# Patient Record
Sex: Male | Born: 1970 | Race: White | Hispanic: No | Marital: Single | Smoking: Never smoker
Health system: Southern US, Community
[De-identification: ages and names within clinical notes are randomized; demographics above are authoritative.]

## PROBLEM LIST (undated history)

## (undated) DIAGNOSIS — E119 Type 2 diabetes mellitus without complications: Secondary | ICD-10-CM

## (undated) DIAGNOSIS — E785 Hyperlipidemia, unspecified: Secondary | ICD-10-CM

## (undated) DIAGNOSIS — R55 Syncope and collapse: Secondary | ICD-10-CM

## (undated) HISTORY — DX: Hyperlipidemia, unspecified: E78.5

## (undated) HISTORY — DX: Syncope and collapse: R55

---

## 1999-04-04 ENCOUNTER — Encounter: Admission: RE | Admit: 1999-04-04 | Discharge: 1999-07-03 | Payer: Self-pay | Admitting: Internal Medicine

## 2007-03-22 ENCOUNTER — Emergency Department: Payer: Self-pay | Admitting: Emergency Medicine

## 2007-03-22 ENCOUNTER — Other Ambulatory Visit: Payer: Self-pay

## 2010-02-21 ENCOUNTER — Other Ambulatory Visit: Payer: Self-pay

## 2011-05-31 ENCOUNTER — Other Ambulatory Visit: Payer: Self-pay

## 2011-05-31 LAB — COMPREHENSIVE METABOLIC PANEL
Albumin: 3.6 g/dL (ref 3.4–5.0)
Alkaline Phosphatase: 88 U/L (ref 50–136)
Anion Gap: 8 (ref 7–16)
BUN: 16 mg/dL (ref 7–18)
Bilirubin,Total: 1.4 mg/dL — ABNORMAL HIGH (ref 0.2–1.0)
Calcium, Total: 8.7 mg/dL (ref 8.5–10.1)
Chloride: 101 mmol/L (ref 98–107)
Co2: 28 mmol/L (ref 21–32)
Creatinine: 1.02 mg/dL (ref 0.60–1.30)
EGFR (African American): >60
EGFR (Non-African Amer.): >60
Glucose: 247 mg/dL — ABNORMAL HIGH (ref 65–99)
Osmolality: 283 (ref 275–301)
Potassium: 4.9 mmol/L (ref 3.5–5.1)
SGOT(AST): 21 U/L (ref 15–37)
SGPT (ALT): 23 U/L
Sodium: 137 mmol/L (ref 136–145)
Total Protein: 7 g/dL (ref 6.4–8.2)

## 2011-05-31 LAB — LIPID PANEL
Cholesterol: 161 mg/dL (ref 0–200)
HDL Cholesterol: 59 mg/dL (ref 40–60)
Ldl Cholesterol, Calc: 89 mg/dL (ref 0–100)
Triglycerides: 65 mg/dL (ref 0–200)
VLDL Cholesterol, Calc: 13 mg/dL (ref 5–40)

## 2011-05-31 LAB — TSH: Thyroid Stimulating Horm: 1.33 u[IU]/mL

## 2011-05-31 LAB — HEMOGLOBIN A1C: Hemoglobin A1C: 8.2 % — ABNORMAL HIGH (ref 4.2–6.3)

## 2011-09-11 ENCOUNTER — Ambulatory Visit: Payer: Self-pay | Admitting: Nephrology

## 2013-10-03 ENCOUNTER — Observation Stay (HOSPITAL_COMMUNITY)
Admission: EM | Admit: 2013-10-03 | Discharge: 2013-10-04 | Disposition: A | Discharge status: Home / Self Care | Payer: 59 | Attending: Internal Medicine | Admitting: Internal Medicine

## 2013-10-03 ENCOUNTER — Emergency Department (HOSPITAL_COMMUNITY): Payer: 59

## 2013-10-03 ENCOUNTER — Encounter (HOSPITAL_COMMUNITY): Payer: Self-pay | Admitting: Emergency Medicine

## 2013-10-03 DIAGNOSIS — E119 Type 2 diabetes mellitus without complications: Secondary | ICD-10-CM | POA: Diagnosis not present

## 2013-10-03 DIAGNOSIS — R55 Syncope and collapse: Secondary | ICD-10-CM | POA: Diagnosis present

## 2013-10-03 DIAGNOSIS — R61 Generalized hyperhidrosis: Secondary | ICD-10-CM | POA: Diagnosis not present

## 2013-10-03 DIAGNOSIS — R0789 Other chest pain: Secondary | ICD-10-CM | POA: Diagnosis not present

## 2013-10-03 DIAGNOSIS — R079 Chest pain, unspecified: Secondary | ICD-10-CM | POA: Insufficient documentation

## 2013-10-03 DIAGNOSIS — Z794 Long term (current) use of insulin: Secondary | ICD-10-CM | POA: Diagnosis not present

## 2013-10-03 DIAGNOSIS — R0602 Shortness of breath: Secondary | ICD-10-CM | POA: Insufficient documentation

## 2013-10-03 DIAGNOSIS — E109 Type 1 diabetes mellitus without complications: Secondary | ICD-10-CM | POA: Diagnosis present

## 2013-10-03 HISTORY — DX: Type 2 diabetes mellitus without complications: E11.9

## 2013-10-03 LAB — CBC
HCT: 40.8 % (ref 39.0–52.0)
Hemoglobin: 14 g/dL (ref 13.0–17.0)
MCH: 31 pg (ref 26.0–34.0)
MCHC: 34.3 g/dL (ref 30.0–36.0)
MCV: 90.5 fL (ref 78.0–100.0)
Platelets: 244 10*3/uL (ref 150–400)
RBC: 4.51 MIL/uL (ref 4.22–5.81)
RDW: 12.2 % (ref 11.5–15.5)
WBC: 5.7 10*3/uL (ref 4.0–10.5)

## 2013-10-03 LAB — BASIC METABOLIC PANEL
Anion gap: 15 (ref 5–15)
BUN: 16 mg/dL (ref 6–23)
CO2: 23 mEq/L (ref 19–32)
Calcium: 8.8 mg/dL (ref 8.4–10.5)
Chloride: 101 mEq/L (ref 96–112)
Creatinine, Ser: 1 mg/dL (ref 0.50–1.35)
GFR calc Af Amer: >90 mL/min (ref >90)
GFR calc non Af Amer: >90 mL/min (ref >90)
Glucose, Bld: 199 mg/dL — ABNORMAL HIGH (ref 70–99)
Potassium: 3.9 mEq/L (ref 3.7–5.3)
Sodium: 139 mEq/L (ref 137–147)

## 2013-10-03 LAB — I-STAT TROPONIN, ED: Troponin i, poc: 0 ng/mL (ref 0.00–0.08)

## 2013-10-03 LAB — CBG MONITORING, ED: Glucose-Capillary: 310 mg/dL — ABNORMAL HIGH (ref 70–99)

## 2013-10-03 LAB — D-DIMER, QUANTITATIVE: D-Dimer, Quant: 0.63 ug/mL-FEU — ABNORMAL HIGH (ref 0.00–0.48)

## 2013-10-03 MED ORDER — ASPIRIN EC 325 MG PO TBEC: 325.0000 mg | DELAYED_RELEASE_TABLET | Freq: Once | ORAL | Status: AC

## 2013-10-03 NOTE — ED Provider Notes (Signed)
I saw and evaluated the patient, reviewed the resident's note and I agree with the findings and plan.   EKG Interpretation   Date/Time:  Friday October 03 2013 20:11:49 EDT Ventricular Rate:  63 PR Interval:  147 QRS Duration: 97 QT Interval:  412 QTC Calculation: 422 R Axis:   27 Text Interpretation:  Sinus rhythm Confirmed by Laddie Math  MD, Neill Jurewicz ([id number])  on 10/03/2013 10:36:19 PM     Patient here after having sudden onset of substernal chest pain with associated diaphoresis and dyspnea. Symptoms lasted for about 5 minutes and resolve spontaneously. EKG and troponin here negative. Patient apprised of cardiac disease but will be admitted for observation   Toy Baker, MD 10/03/13 2241

## 2013-10-03 NOTE — ED Notes (Signed)
Pt in via EMS after episode of chest pain that started during dinner, pain was sudden onset and described as squeezing, pt was diaphoretic upon EMS arrival and pale, pt had syncopal episode during EMS presence, pt hypotensive during that time, rebounded with fluid bolus, pt alert and oriented on arrival to ED, denies pain or any symptoms at this time, pt skin warm and dry, CBG 164, IV 16g in LAC, given 324 ASA by EMS

## 2013-10-03 NOTE — ED Provider Notes (Signed)
CSN: [id number]     Arrival date & time 10/03/13  2003 History   First MD Initiated Contact with Patient 10/03/13 2006     Chief Complaint  Patient presents with  . Chest Pain     (Consider location/radiation/quality/duration/timing/severity/associated sxs/prior Treatment) HPI JURIEL CID 43 y.o. with a history of IDDM who presents to the ED with transient chest pain. Pain started at about 6 pm. It was squeezing in character. It was non radiating. Non known exacerbating or relieving factors. It was associated with diaphoresis. He participates in a vigorous work out routine several times a week and has not had any exertional symptoms. Following the chest pain he developed syncope following this. Pain has since resolved.   Past Medical History  Diagnosis Date  . Diabetes mellitus without complication    History reviewed. No pertinent past surgical history. History reviewed. No pertinent family history. History  Substance Use Topics  . Smoking status: Never Smoker   . Smokeless tobacco: Not on file  . Alcohol Use: Not on file    Review of Systems  Respiratory: Positive for shortness of breath.   Cardiovascular: Positive for chest pain.  All other systems reviewed and are negative.     Allergies  Review of patient's allergies indicates no known allergies.  Home Medications   Prior to Admission medications   Medication Sig Start Date End Date Taking? Authorizing Provider  Insulin Human (INSULIN PUMP) SOLN Inject into the skin continuous. "novolog"   Yes Historical Provider, MD   BP 123/79  Pulse 76  Temp(Src) 98.5 F (36.9 C) (Oral)  Resp 18  SpO2 96% Physical Exam  Nursing note and vitals reviewed. Constitutional: He is oriented to person, place, and time. He appears well-developed and well-nourished. No distress.  HENT:  Head: Normocephalic and atraumatic.  Eyes: Conjunctivae and EOM are normal. Right eye exhibits no discharge. Left eye exhibits no discharge.   Neck: Normal range of motion. Neck supple. No tracheal deviation present.  Cardiovascular: Normal rate, regular rhythm and normal heart sounds.  Exam reveals no friction rub.   No murmur heard. Pulmonary/Chest: Effort normal and breath sounds normal. No stridor. No respiratory distress. He has no wheezes. He has no rales. He exhibits no tenderness.  Abdominal: Soft. He exhibits no distension. There is no tenderness. There is no rebound and no guarding.  Neurological: He is alert and oriented to person, place, and time.  Skin: Skin is warm.  Psychiatric: He has a normal mood and affect.    ED Course  Procedures (including critical care time) Labs Review Labs Reviewed  BASIC METABOLIC PANEL - Abnormal; Notable for the following:    Glucose, Bld 199 (*)    All other components within normal limits  CBG MONITORING, ED - Abnormal; Notable for the following:    Glucose-Capillary 310 (*)    All other components within normal limits  CBC  D-DIMER, QUANTITATIVE  I-STAT TROPOININ, ED    Imaging Review Dg Chest 2 View  10/03/2013   CLINICAL DATA:  Sudden sharp chest pain in the sternum that has since stopped.  EXAM: CHEST  2 VIEW  COMPARISON:  None.  FINDINGS: The heart size and mediastinal contours are within normal limits. Both lungs are clear. The visualized skeletal structures are unremarkable.  IMPRESSION: No active cardiopulmonary disease.   Electronically Signed   By: Burman Nieves M.D.   On: 10/03/2013 22:02     EKG Interpretation   Date/Time:  Friday October 03 2013  20:11:49 EDT Ventricular Rate:  63 PR Interval:  147 QRS Duration: 97 QT Interval:  412 QTC Calculation: 422 R Axis:   27 Text Interpretation:  Sinus rhythm Confirmed by Freida Busman  MD, ANTHONY ([id number])  on 10/03/2013 10:36:19 PM      MDM   Final diagnoses:  None    Yaakov Guthrie 43 y.o. with cardiac risk factors that include IDDM who presents with typical chest pain and syncope. Pain has resolved. Was  hypotensive with EMS and required IVF, and BP improved after 1 L IVF. NO cardiac history. AFVSS. NAD. Pain resolved. O2 sats wnls on room air. ECG neg for acute ischemia changes. Trop neg. Given syncope, hypotension and chest pain - patient is high risk and a rule out is indicated. He was admitted to the hospitalist. Care discussed with my attending, Dr. Freida Busman. I considered PE, but given his symptoms were transient, this is likely related to a cardiac event vs arrhythmia.     Sena Hitch, MD 10/03/13 [phone]

## 2013-10-04 ENCOUNTER — Observation Stay (HOSPITAL_COMMUNITY): Payer: 59

## 2013-10-04 ENCOUNTER — Encounter (HOSPITAL_COMMUNITY): Payer: Self-pay | Admitting: Internal Medicine

## 2013-10-04 DIAGNOSIS — E109 Type 1 diabetes mellitus without complications: Secondary | ICD-10-CM

## 2013-10-04 DIAGNOSIS — R079 Chest pain, unspecified: Secondary | ICD-10-CM

## 2013-10-04 DIAGNOSIS — R55 Syncope and collapse: Secondary | ICD-10-CM | POA: Diagnosis present

## 2013-10-04 LAB — CBC WITH DIFFERENTIAL/PLATELET
Basophils Absolute: 0 10*3/uL (ref 0.0–0.1)
Basophils Relative: 0 % (ref 0–1)
Eosinophils Absolute: 0.3 10*3/uL (ref 0.0–0.7)
Eosinophils Relative: 3 % (ref 0–5)
HCT: 39.4 % (ref 39.0–52.0)
Hemoglobin: 13.3 g/dL (ref 13.0–17.0)
Lymphocytes Relative: 31 % (ref 12–46)
Lymphs Abs: 2.3 10*3/uL (ref 0.7–4.0)
MCH: 31.4 pg (ref 26.0–34.0)
MCHC: 33.8 g/dL (ref 30.0–36.0)
MCV: 92.9 fL (ref 78.0–100.0)
Monocytes Absolute: 0.5 10*3/uL (ref 0.1–1.0)
Monocytes Relative: 6 % (ref 3–12)
Neutro Abs: 4.4 10*3/uL (ref 1.7–7.7)
Neutrophils Relative %: 60 % (ref 43–77)
Platelets: 223 10*3/uL (ref 150–400)
RBC: 4.24 MIL/uL (ref 4.22–5.81)
RDW: 12.4 % (ref 11.5–15.5)
WBC: 7.4 10*3/uL (ref 4.0–10.5)

## 2013-10-04 LAB — RAPID URINE DRUG SCREEN, HOSP PERFORMED
Amphetamines: NOT DETECTED
Barbiturates: NOT DETECTED
Benzodiazepines: NOT DETECTED
Cocaine: NOT DETECTED
Opiates: NOT DETECTED
Tetrahydrocannabinol: NOT DETECTED

## 2013-10-04 LAB — GLUCOSE, CAPILLARY
Glucose-Capillary: 144 mg/dL — ABNORMAL HIGH (ref 70–99)
Glucose-Capillary: 191 mg/dL — ABNORMAL HIGH (ref 70–99)
Glucose-Capillary: 221 mg/dL — ABNORMAL HIGH (ref 70–99)
Glucose-Capillary: 223 mg/dL — ABNORMAL HIGH (ref 70–99)
Glucose-Capillary: 53 mg/dL — ABNORMAL LOW (ref 70–99)

## 2013-10-04 LAB — COMPREHENSIVE METABOLIC PANEL
ALT: 12 U/L (ref 0–53)
AST: 12 U/L (ref 0–37)
Albumin: 3.4 g/dL — ABNORMAL LOW (ref 3.5–5.2)
Alkaline Phosphatase: 93 U/L (ref 39–117)
Anion gap: 13 (ref 5–15)
BUN: 15 mg/dL (ref 6–23)
CO2: 25 mEq/L (ref 19–32)
Calcium: 8.6 mg/dL (ref 8.4–10.5)
Chloride: 96 mEq/L (ref 96–112)
Creatinine, Ser: 0.98 mg/dL (ref 0.50–1.35)
GFR calc Af Amer: >90 mL/min (ref >90)
GFR calc non Af Amer: >90 mL/min (ref >90)
Glucose, Bld: 367 mg/dL — ABNORMAL HIGH (ref 70–99)
Potassium: 5.3 mEq/L (ref 3.7–5.3)
Sodium: 134 mEq/L — ABNORMAL LOW (ref 137–147)
Total Bilirubin: 1.2 mg/dL (ref 0.3–1.2)
Total Protein: 6.2 g/dL (ref 6.0–8.3)

## 2013-10-04 LAB — TROPONIN I
Troponin I: <0.3 ng/mL (ref <0.30)
Troponin I: <0.3 ng/mL (ref <0.30)
Troponin I: <0.3 ng/mL (ref <0.30)

## 2013-10-04 LAB — CREATININE, URINE, RANDOM: Creatinine, Urine: 255.11 mg/dL

## 2013-10-04 LAB — SODIUM, URINE, RANDOM: Sodium, Ur: 130 mEq/L

## 2013-10-04 MED ORDER — IOHEXOL 350 MG/ML SOLN
100.0000 mL | Freq: Once | INTRAVENOUS | Status: AC | PRN
  Administered 2013-10-04: 100 mL via INTRAVENOUS

## 2013-10-04 MED ORDER — SODIUM CHLORIDE 0.9 % IV SOLN
INTRAVENOUS | Status: DC
  Administered 2013-10-04: 04:00:00 via INTRAVENOUS

## 2013-10-04 MED ORDER — INSULIN PUMP
SUBCUTANEOUS | Status: DC
  Administered 2013-10-04: 10 via SUBCUTANEOUS
  Filled 2013-10-04: qty 1

## 2013-10-04 MED ORDER — ONDANSETRON HCL 4 MG/2ML IJ SOLN: 4.0000 mg | Freq: Four times a day (QID) | INTRAMUSCULAR | Status: DC | PRN

## 2013-10-04 MED ORDER — ACETAMINOPHEN 650 MG RE SUPP: 650.0000 mg | Freq: Four times a day (QID) | RECTAL | Status: DC | PRN

## 2013-10-04 MED ORDER — ONDANSETRON HCL 4 MG PO TABS: 4.0000 mg | ORAL_TABLET | Freq: Four times a day (QID) | ORAL | Status: DC | PRN

## 2013-10-04 MED ORDER — SODIUM CHLORIDE 0.9 % IV BOLUS (SEPSIS): 500.0000 mL | Freq: Once | INTRAVENOUS | Status: DC

## 2013-10-04 MED ORDER — SODIUM CHLORIDE 0.9 % IJ SOLN
3.0000 mL | Freq: Two times a day (BID) | INTRAMUSCULAR | Status: DC
  Administered 2013-10-04: 3 mL via INTRAVENOUS

## 2013-10-04 MED ORDER — ENOXAPARIN SODIUM 40 MG/0.4ML ~~LOC~~ SOLN
40.0000 mg | Freq: Every day | SUBCUTANEOUS | Status: DC
Start: 2013-10-04 — End: 2013-10-04
  Administered 2013-10-04: 40 mg via SUBCUTANEOUS
  Filled 2013-10-04: qty 0.4

## 2013-10-04 MED ORDER — MORPHINE SULFATE 2 MG/ML IJ SOLN: 1.0000 mg | INTRAMUSCULAR | Status: DC | PRN

## 2013-10-04 MED ORDER — ACETAMINOPHEN 325 MG PO TABS: 650.0000 mg | ORAL_TABLET | Freq: Four times a day (QID) | ORAL | Status: DC | PRN

## 2013-10-04 MED ORDER — ASPIRIN EC 325 MG PO TBEC
325.0000 mg | DELAYED_RELEASE_TABLET | Freq: Every day | ORAL | Status: DC
  Administered 2013-10-04: 325 mg via ORAL
  Filled 2013-10-04: qty 1

## 2013-10-04 NOTE — Progress Notes (Signed)
  2D Echocardiogram has been performed.  Marvin Schmidt 10/04/2013, 10:25 AM

## 2013-10-04 NOTE — H&P (Signed)
[facility] History and Physical  Marvin Schmidt ZOX:[id number] DOB: February 22, 1970 DOA: 10/03/2013  Referring physician: ER physician. PCP: No primary provider on file.   Chief Complaint: Chest pain.  HPI: Marvin Schmidt is a 43 y.o. male with history of diabetes mellitus type 1 on insulin pump started developing chest pain last evening while having supper. Pain was retrosternal pressure-like and was associated with diaphoresis. Denies any nausea vomiting or shortness of breath. Patient called EMS. When the EMS arrived patient stood up and had a syncopal episode for a few seconds. As per the EMS patient's blood pressure was low in the 80 systolic. Patient was given 1 L normal saline bolus following which patient blood pressure improved. Patient was given aspirin and brought to the ER. Patient presently chest pain-free. Cardiac markers have been negative. Patient will be admitted for further observation and management of chest pain. Patient denies any fever chills productive cough abdominal pain. Patient's blood sugar and her syncopal episode was around 160.   Review of Systems: As presented in the history of presenting illness, rest negative.  Past Medical History  Diagnosis Date  . Diabetes mellitus without complication    History reviewed. No pertinent past surgical history. Social History:  reports that he has never smoked. He does not have any smokeless tobacco history on file. He reports that he drinks alcohol. He reports that he does not use illicit drugs. Where does patient live home. Can patient participate in ADLs? Yes.  No Known Allergies  Family History:  Family History  Problem Relation Age of Onset  . Colon cancer Father   . Diabetes Mellitus II Neg Hx   . CAD Neg Hx       Prior to Admission medications   Medication Sig Start Date End Date Taking? Authorizing Provider  Insulin Human (INSULIN PUMP) SOLN Inject into the skin continuous. "novolog"   Yes Historical  Provider, MD    Physical Exam: Filed Vitals:   10/03/13 2257 10/03/13 2300 10/03/13 2330 10/04/13 0018  BP: 122/84 136/84 123/79 130/84  Pulse: 78 80 76 71  Temp: 98.5 F (36.9 C)   98.6 F (37 C)  TempSrc: Oral   Oral  Resp: Height:    6' (1.829 m)  Weight:    104.418 kg (230 lb 3.2 oz)  SpO2: 100% 96% 96% 98%     General:  Well developed and nourished.  Eyes: Anicteric no pallor.  ENT: No discharge from the ears eyes nose mouth.  Neck: No mass felt.  Cardiovascular: S1-S2 heard.  Respiratory: Bilateral expiratory wheeze heard no crepitations.  Abdomen: Soft nontender bowel sounds present.  Skin: No rash.  Musculoskeletal: No edema.  Psychiatric: Appears normal.  Neurologic: Alert awake oriented to time place and person. Moves all extremities.  Labs on Admission:  Basic Metabolic Panel:  Recent Labs Lab 10/03/13 2019  NA 139  K 3.9  CL 101  CO2 23  GLUCOSE 199*  BUN 16  CREATININE 1.00  CALCIUM 8.8   Liver Function Tests: No results found for this basename: AST, ALT, ALKPHOS, BILITOT, PROT, ALBUMIN,  in the last 168 hours No results found for this basename: LIPASE, AMYLASE,  in the last 168 hours No results found for this basename: AMMONIA,  in the last 168 hours CBC:  Recent Labs Lab 10/03/13 2019  WBC 5.7  HGB 14.0  HCT 40.8  MCV 90.5  PLT 244   Cardiac Enzymes: No results found for  this basename: CKTOTAL, CKMB, CKMBINDEX, TROPONINI,  in the last 168 hours  BNP (last 3 results) No results found for this basename: PROBNP,  in the last 8760 hours CBG:  Recent Labs Lab 10/03/13 2235 10/04/13 0015  GLUCAP 310* 191*    Radiological Exams on Admission: Dg Chest 2 View  10/03/2013   CLINICAL DATA:  Sudden sharp chest pain in the sternum that has since stopped.  EXAM: CHEST  2 VIEW  COMPARISON:  None.  FINDINGS: The heart size and mediastinal contours are within normal limits. Both lungs are clear. The visualized skeletal  structures are unremarkable.  IMPRESSION: No active cardiopulmonary disease.   Electronically Signed   By: Burman Nieves M.D.   On: 10/03/2013 22:02    EKG: Independently reviewed. Normal sinus rhythm with nonspecific ST-T changes.  Assessment/Plan Principal Problem:   Chest pain Active Problems:   Diabetes mellitus type 1   Syncope   1. Chest pain - at this time we will cycle cardiac markers. Check 2-D echo. Keep patient n.p.o. past 4 AM. Aspirin.. Nitroglycerin. Since patient's d-dimer is mildly elevated get a CT angiogram of the chest to rule out PE. 2. Diabetes mellitus type 1 - on insulin pump. 3. Syncope - may be vasovagal induced. Continue gentle hydration. Check orthostatics in a.m. Check 2-D echo. Closely monitor in telemetry.    Code Status: Full code.  Family Communication: None.  Disposition Plan: Admit for observation.    KAKRAKANDY,ARSHAD N. [facility] Pager [phone].  If 7PM-7AM, please contact night-coverage [email or web address] Password [email or web address] 10/04/2013, 1:49 AM

## 2013-10-04 NOTE — Progress Notes (Addendum)
[facility] PROGRESS NOTE Assessment/Plan: Syncope/  Chest pain: - he describes very nicely LOC upon standing. - Check orthostatic. Hyponatremic and chloride less than 100 on admission. - cardiac markers negative x 1. - check urine sodium and cr. - BG was high on admission ? If contributing to intravascular to deputation.  Diabetes mellitus type 1 - Cont on Insulin pump.  Code Status: full Family Communication: none  Disposition Plan: observation   Consultants:  none  Procedures:  CT angio: negative for PE.  Antibiotics:  none   HPI/Subjective: No complains  Objective: Filed Vitals:   10/03/13 2300 10/03/13 2330 10/04/13 0018 10/04/13 0516  BP: 136/84 123/79 130/84 134/89  Pulse: 80 76 71 71  Temp:   98.6 F (37 C) 97.8 F (36.6 C)  TempSrc:   Oral Oral  Resp: Height:   6' (1.829 m)   Weight:   104.418 kg (230 lb 3.2 oz) 104.418 kg (230 lb 3.2 oz)  SpO2: 96% 96% 98% 98%   No intake or output data in the 24 hours ending 10/04/13 0817 Filed Weights   10/04/13 0018 10/04/13 0516  Weight: 104.418 kg (230 lb 3.2 oz) 104.418 kg (230 lb 3.2 oz)    Exam:  General: Alert, awake, oriented x3, in no acute distress.  HEENT: No bruits, no goiter.  Heart: Regular rate and rhythm.  Lungs: Good air movement, clear. Abdomen: Soft, nontender, nondistended, positive bowel sounds.  Neuro: Grossly intact, nonfocal.   Data Reviewed: Basic Metabolic Panel:  Recent Labs Lab 10/03/13 2019 10/04/13 0359  NA 139 134*  K 3.9 5.3  CL 101 96  CO2 23 25  GLUCOSE 199* 367*  BUN 16 15  CREATININE 1.00 0.98  CALCIUM 8.8 8.6   Liver Function Tests:  Recent Labs Lab 10/04/13 0359  AST 12  ALT 12  ALKPHOS 93  BILITOT 1.2  PROT 6.2  ALBUMIN 3.4*   No results found for this basename: LIPASE, AMYLASE,  in the last 168 hours No results found for this basename: AMMONIA,  in the last 168 hours CBC:  Recent Labs Lab 10/03/13 2019  10/04/13 0359  WBC 5.7 7.4  NEUTROABS  --  4.4  HGB 14.0 13.3  HCT 40.8 39.4  MCV 90.5 92.9  PLT 244 223   Cardiac Enzymes:  Recent Labs Lab 10/04/13 0400  TROPONINI <0.30   BNP (last 3 results) No results found for this basename: PROBNP,  in the last 8760 hours CBG:  Recent Labs Lab 10/03/13 2235 10/04/13 0015 10/04/13 0516 10/04/13 0712  GLUCAP 310* 191* 223* 144*    No results found for this or any previous visit (from the past 240 hour(s)).   Studies: Dg Chest 2 View  10/03/2013   CLINICAL DATA:  Sudden sharp chest pain in the sternum that has since stopped.  EXAM: CHEST  2 VIEW  COMPARISON:  None.  FINDINGS: The heart size and mediastinal contours are within normal limits. Both lungs are clear. The visualized skeletal structures are unremarkable.  IMPRESSION: No active cardiopulmonary disease.   Electronically Signed   By: Burman Nieves M.D.   On: 10/03/2013 22:02   Ct Angio Chest Pe W/cm &/or Wo Cm  10/04/2013   CLINICAL DATA:  Chest pain and shortness of breath since 6 p.m. tonight. Slightly increased D-dimer.  EXAM: CT ANGIOGRAPHY CHEST WITH CONTRAST  TECHNIQUE: Multidetector CT imaging of the chest was performed using the standard protocol during bolus administration of  intravenous contrast. Multiplanar CT image reconstructions and MIPs were obtained to evaluate the vascular anatomy.  CONTRAST:  OMNIPAQUE IOHEXOL 350 MG/ML SOLN  COMPARISON:  None.  FINDINGS: Technically adequate study with moderately good opacification of the central and segmental pulmonary arteries. No filling defects are demonstrated. No evidence of significant pulmonary embolus.  Normal heart size. Normal caliber thoracic aorta without evidence of dissection. Great vessel origins are patent. Esophagus is decompressed. Scattered mediastinal lymph nodes are not pathologically enlarged.  Dependent atelectasis in the posterior lungs. No focal airspace disease or consolidation. No pleural effusion.  No pneumothorax.  Included portions of the upper abdominal organs demonstrate no gross focal abnormality. No destructive bone lesions appreciated.  Review of the MIP images confirms the above findings.  IMPRESSION: No evidence of significant pulmonary embolus.   Electronically Signed   By: Burman Nieves M.D.   On: 10/04/2013 03:36    Scheduled Meds: . aspirin EC  325 mg Oral Daily  . enoxaparin (LOVENOX) injection  40 mg Subcutaneous Daily  . insulin pump   Subcutaneous 6 times per day  . sodium chloride  3 mL Intravenous Q12H   Continuous Infusions: . sodium chloride 100 mL/hr at 10/04/13 0338     Marinda Elk  [facility] Pager [phone]. If 8PM-8AM, please contact night-coverage at [email or web address], password [facility] 10/04/2013, 8:17 AM  LOS: 1 day     **Disclaimer: This note may have been dictated with voice recognition software. Similar sounding words can inadvertently be transcribed and this note may contain transcription errors which may not have been corrected upon publication of note.**

## 2013-10-04 NOTE — Progress Notes (Signed)
Pt CBG 53 pt drank OJ. Recheck 114 asymptomatic

## 2013-10-04 NOTE — Progress Notes (Signed)
UR completed 

## 2013-10-04 NOTE — Discharge Summary (Signed)
Physician Discharge Summary  Marvin Schmidt:[id number] DOB: 12-25-1970 DOA: 10/03/2013  PCP: No primary provider on file.  Admit date: 10/03/2013 Discharge date: 10/04/2013  Time spent: 35 minutes  Recommendations for Outpatient Follow-up:  1. Follow up with PCP in 2 -4 weeks.  Discharge Diagnoses:  Principal Problem:   Syncope Active Problems:   Chest pain   Diabetes mellitus type 1   Discharge Condition: stable  Diet recommendation: Carb modified  Filed Weights   10/04/13 0018 10/04/13 0516  Weight: 104.418 kg (230 lb 3.2 oz) 104.418 kg (230 lb 3.2 oz)    History of present illness:  43 y.o. male with history of diabetes mellitus type 1 on insulin pump started developing chest pain last evening while having supper. Pain was retrosternal pressure-like and was associated with diaphoresis. Denies any nausea vomiting or shortness of breath. Patient called EMS. When the EMS arrived patient stood up and had a syncopal episode for a few seconds. As per the EMS patient's blood pressure was low in the 80 systolic. Patient was given 1 L normal saline bolus following which patient blood pressure improved. Patient was given aspirin and brought to the ER. Patient presently chest pain-free. Cardiac markers have been negative. Patient will be admitted for further observation and management of chest pain. Patient denies any fever chills productive cough abdominal pain. Patient's blood sugar and her syncopal episode was around 160.      Hospital Course:  Syncope/ Chest pain:  - He describes very nicely LOC upon standing. Only lasting a few seconds.  - negative orthostatic. Hyponatremic and chloride less than 100 on admission.  - Fractional excretion of sodium < 1% - cardiac markers negative x 3.  - BG was high on admission ? If contributing to intravascular to depletion.  Diabetes mellitus type 1  - Cont on Insulin pump.   Procedures:  Echo 9.5.2015: no abnormalities, Normal  EF.   Consultations:  None  Discharge Exam: Filed Vitals:   10/04/13 1108  BP: 118/88  Pulse: 70  Temp:   Resp:     General: See progress note  Discharge Instructions You were cared for by a hospitalist during your hospital stay. If you have any questions about your discharge medications or the care you received while you were in the hospital after you are discharged, you can call the unit and asked to speak with the hospitalist on call if the hospitalist that took care of you is not available. Once you are discharged, your primary care physician will handle any further medical issues. Please note that NO REFILLS for any discharge medications will be authorized once you are discharged, as it is imperative that you return to your primary care physician (or establish a relationship with a primary care physician if you do not have one) for your aftercare needs so that they can reassess your need for medications and monitor your lab values.  Discharge Instructions   Diet - low sodium heart healthy    Complete by:  As directed      Increase activity slowly    Complete by:  As directed           Current Discharge Medication List    CONTINUE these medications which have NOT CHANGED   Details  Insulin Human (INSULIN PUMP) SOLN Inject into the skin continuous. "novolog"       No Known Allergies    The results of significant diagnostics from this hospitalization (including imaging, microbiology, ancillary and laboratory)  are listed below for reference.    Significant Diagnostic Studies: Dg Chest 2 View  10/03/2013   CLINICAL DATA:  Sudden sharp chest pain in the sternum that has since stopped.  EXAM: CHEST  2 VIEW  COMPARISON:  None.  FINDINGS: The heart size and mediastinal contours are within normal limits. Both lungs are clear. The visualized skeletal structures are unremarkable.  IMPRESSION: No active cardiopulmonary disease.   Electronically Signed   By: Burman Nieves M.D.    On: 10/03/2013 22:02   Ct Angio Chest Pe W/cm &/or Wo Cm  10/04/2013   CLINICAL DATA:  Chest pain and shortness of breath since 6 p.m. tonight. Slightly increased D-dimer.  EXAM: CT ANGIOGRAPHY CHEST WITH CONTRAST  TECHNIQUE: Multidetector CT imaging of the chest was performed using the standard protocol during bolus administration of intravenous contrast. Multiplanar CT image reconstructions and MIPs were obtained to evaluate the vascular anatomy.  CONTRAST:  OMNIPAQUE IOHEXOL 350 MG/ML SOLN  COMPARISON:  None.  FINDINGS: Technically adequate study with moderately good opacification of the central and segmental pulmonary arteries. No filling defects are demonstrated. No evidence of significant pulmonary embolus.  Normal heart size. Normal caliber thoracic aorta without evidence of dissection. Great vessel origins are patent. Esophagus is decompressed. Scattered mediastinal lymph nodes are not pathologically enlarged.  Dependent atelectasis in the posterior lungs. No focal airspace disease or consolidation. No pleural effusion. No pneumothorax.  Included portions of the upper abdominal organs demonstrate no gross focal abnormality. No destructive bone lesions appreciated.  Review of the MIP images confirms the above findings.  IMPRESSION: No evidence of significant pulmonary embolus.   Electronically Signed   By: Burman Nieves M.D.   On: 10/04/2013 03:36    Microbiology: No results found for this or any previous visit (from the past 240 hour(s)).   Labs: Basic Metabolic Panel:  Recent Labs Lab 10/03/13 2019 10/04/13 0359  NA 139 134*  K 3.9 5.3  CL 101 96  CO2 23 25  GLUCOSE 199* 367*  BUN 16 15  CREATININE 1.00 0.98  CALCIUM 8.8 8.6   Liver Function Tests:  Recent Labs Lab 10/04/13 0359  AST 12  ALT 12  ALKPHOS 93  BILITOT 1.2  PROT 6.2  ALBUMIN 3.4*   No results found for this basename: LIPASE, AMYLASE,  in the last 168 hours No results found for this basename:  AMMONIA,  in the last 168 hours CBC:  Recent Labs Lab 10/03/13 2019 10/04/13 0359  WBC 5.7 7.4  NEUTROABS  --  4.4  HGB 14.0 13.3  HCT 40.8 39.4  MCV 90.5 92.9  PLT 244 223   Cardiac Enzymes:  Recent Labs Lab 10/04/13 0400 10/04/13 1025  TROPONINI <0.30 <0.30   BNP: BNP (last 3 results) No results found for this basename: PROBNP,  in the last 8760 hours CBG:  Recent Labs Lab 10/04/13 0015 10/04/13 0516 10/04/13 0712 10/04/13 1135 10/04/13 1613  GLUCAP 191* 223* 144* 53* 221*       Signed:  David Stall, ABRAHAM  [facility] 10/04/2013, 5:19 PM

## 2013-10-05 NOTE — ED Provider Notes (Signed)
I saw and evaluated the patient, reviewed the resident's note and I agree with the findings and plan.  Toy Baker, MD 10/05/13 [phone]

## 2013-10-27 ENCOUNTER — Ambulatory Visit: Payer: 59 | Admitting: Cardiovascular Disease

## 2013-11-03 ENCOUNTER — Ambulatory Visit (INDEPENDENT_AMBULATORY_CARE_PROVIDER_SITE_OTHER): Payer: 59 | Admitting: Cardiovascular Disease

## 2013-11-03 ENCOUNTER — Encounter: Payer: Self-pay | Admitting: Cardiovascular Disease

## 2013-11-03 VITALS — BP 140/86 | HR 65 | Ht 72.0 in | Wt 233.5 lb

## 2013-11-03 DIAGNOSIS — H9190 Unspecified hearing loss, unspecified ear: Secondary | ICD-10-CM | POA: Insufficient documentation

## 2013-11-03 DIAGNOSIS — E109 Type 1 diabetes mellitus without complications: Secondary | ICD-10-CM

## 2013-11-03 DIAGNOSIS — R55 Syncope and collapse: Secondary | ICD-10-CM

## 2013-11-03 DIAGNOSIS — H9193 Unspecified hearing loss, bilateral: Secondary | ICD-10-CM

## 2013-11-03 NOTE — Assessment & Plan Note (Signed)
Acute loss of hearing in association with the diaphoresis, low blood pressure and syncope. Hearing has returned to normal. Unlikely to be a intrinsic issue with his hearing likely subsequent symptom from low blood pressure.

## 2013-11-03 NOTE — Assessment & Plan Note (Signed)
Managed by primary care. Long history of type 1 diabetes

## 2013-11-03 NOTE — Assessment & Plan Note (Signed)
Etiology of his syncopal episode sounds very much like vasovagal. He was orthostatic, low blood pressures measured by EMTs. Trigger was unclear. Initial symptom was loss of hearing. Uncertain if this is the primary cause or a subsequent symptom from low blood pressure. Workup was essentially negative. We have reviewed his CT scan and there is no coronary calcification noted indicating low likelihood of coronary artery disease. EKG is normal, echo normal, exercising without any restrictions. Given that he is doing well, we have not ordered a stress test. We have suggested he monitor his heart rate using his phone application pulse meter. 30 day monitor could be ordered if he has recurrent symptoms. Encouraged him to stay hydrated, measure blood pressure at home for any episodes of dizziness

## 2013-11-03 NOTE — Patient Instructions (Signed)
You are doing well. No medication changes were made.  Please monitor your blood pressure and heart rate for any symptoms  Call the office if you have symptoms We could order a treadmill, or 30 days monitors  Please call us if you have new issues that need to be addressed before your next appt.

## 2013-11-03 NOTE — Progress Notes (Signed)
Patient ID: Marvin Schmidt, male    DOB: Sep 30, 1970, 43 y.o.   MRN: [id number]  HPI Comments: Mr. Marvin Schmidt is a pleasant 43 year old male with no prior cardiac history, history of type 1 diabetes, who presents for evaluation after recent syncope.  He reports that he was at [employer], had not started to eat yet when he had acute onset of loss of hearing, then associated with some shortness of breath. He felt his sugars were low and he drank some Coca-Cola. This did not seem to help and he became more diaphoretic. He walked outside the restaurant with his family, and they called 911. He started to feel better and went EMTs arrived he stood up. He had acute onset of lightheadedness with syncope. The next thing he knows he was being evaluated by EMTs and being taken to the hospital.  In the hospital, lab work was normal including negative cardiac enzymes, normal CBC, basic metabolic panel. Echocardiogram was normal with normal ejection fraction  CT scan of the chest was essentially normal, no PE, no coronary artery disease Images were reviewed by myself that showed no coronary calcification, no aorta calcification  Since then he has worn a heart rate monitor which has shown normal heart rate, no signs of bradycardia or tachycardia. He's been exercising at the gym. He reports that he can exercise to a very high level, reaching heart rates 160s to 170s He denies having symptoms like this in the past. On a regular basis, does not have low sugars, does not have orthostasis. No chest pain with exertion Recent hemoglobin A1c 7.9  EKG shows normal sinus rhythm with rate 65 beats a minute, no significant ST or T wave changes   Outpatient Encounter Prescriptions as of 11/03/2013  Medication Sig  . Insulin Human (INSULIN PUMP) SOLN Inject into the skin continuous. "novolog"    Review of Systems  Constitutional: Negative.   HENT: Negative.   Eyes: Negative.   Respiratory: Negative.    Cardiovascular: Negative.   Gastrointestinal: Negative.   Endocrine: Negative.   Musculoskeletal: Negative.   Skin: Negative.   Allergic/Immunologic: Negative.   Neurological: Negative.   Hematological: Negative.   Psychiatric/Behavioral: Negative.   All other systems reviewed and are negative.   BP 140/86  Pulse 65  Ht 6' (1.829 m)  Wt 233 lb 8 oz (105.915 kg)  BMI 31.66 kg/m2  Physical Exam  Nursing note and vitals reviewed. Constitutional: He is oriented to person, place, and time. He appears well-developed and well-nourished.  HENT:  Head: Normocephalic.  Nose: Nose normal.  Mouth/Throat: Oropharynx is clear and moist.  Eyes: Conjunctivae are normal. Pupils are equal, round, and reactive to light.  Neck: Normal range of motion. Neck supple. No JVD present.  Cardiovascular: Normal rate, regular rhythm, S1 normal, S2 normal, normal heart sounds and intact distal pulses.  Exam reveals no gallop and no friction rub.   No murmur heard. Pulmonary/Chest: Effort normal and breath sounds normal. No respiratory distress. He has no wheezes. He has no rales. He exhibits no tenderness.  Abdominal: Soft. Bowel sounds are normal. He exhibits no distension. There is no tenderness.  Musculoskeletal: Normal range of motion. He exhibits no edema and no tenderness.  Lymphadenopathy:    He has no cervical adenopathy.  Neurological: He is alert and oriented to person, place, and time. Coordination normal.  Skin: Skin is warm and dry. No rash noted. No erythema.  Psychiatric: He has a normal mood and affect. His behavior  is normal. Judgment and thought content normal.      Assessment and Plan

## 2015-01-04 ENCOUNTER — Ambulatory Visit
Admission: RE | Admit: 2015-01-04 | Discharge: 2015-01-04 | Disposition: A | Discharge status: Home / Self Care | Payer: 59 | Source: Ambulatory Visit | Attending: Nephrology | Admitting: Nephrology

## 2015-01-04 ENCOUNTER — Other Ambulatory Visit: Payer: Self-pay | Admitting: Nephrology

## 2015-01-04 DIAGNOSIS — M545 Low back pain: Secondary | ICD-10-CM

## 2015-03-31 IMAGING — CT CT ANGIO CHEST
1 of 8 series · 17 of 36 positions shown · IV contrast (Iohexol (Omnipaque 350))
Comparison: None.

CLINICAL DATA: Chest pain and shortness of breath since 6 p.m.
tonight. Slightly increased D-dimer.

EXAM:
CT ANGIOGRAPHY CHEST WITH CONTRAST
TECHNIQUE: Multidetector CT imaging of the chest was performed using the
standard protocol during bolus administration of intravenous
contrast. Multiplanar CT image reconstructions and MIPs were
obtained to evaluate the vascular anatomy.
CONTRAST:  100mL OMNIPAQUE IOHEXOL 350 MG/ML SOLN

[Series 407: thins pacs · axial · 0.72mm/px · z∈[-429,-166]mm · 17 of 297 slices shown]
[im 17/297  lung]
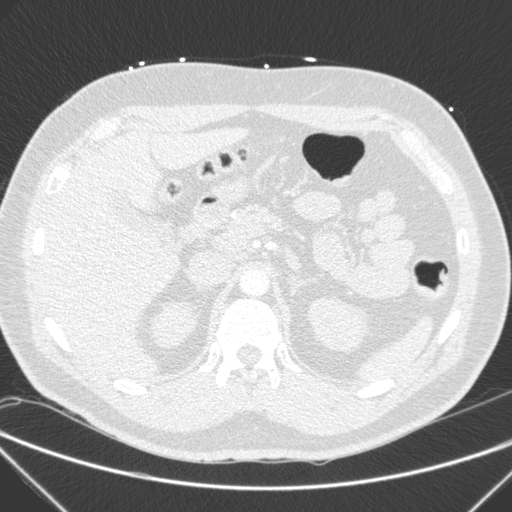
[im 33/297  mediastinal]
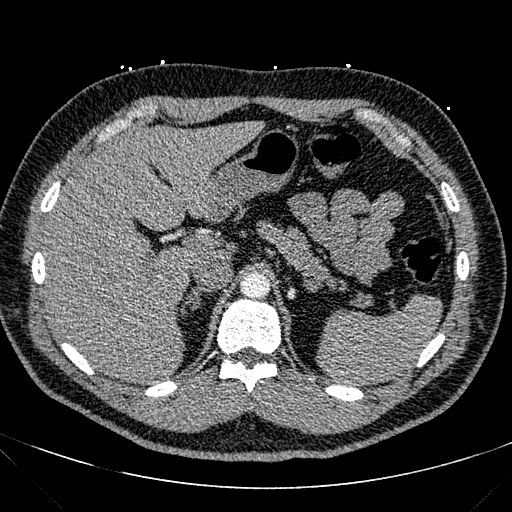
[im 50/297  lung]
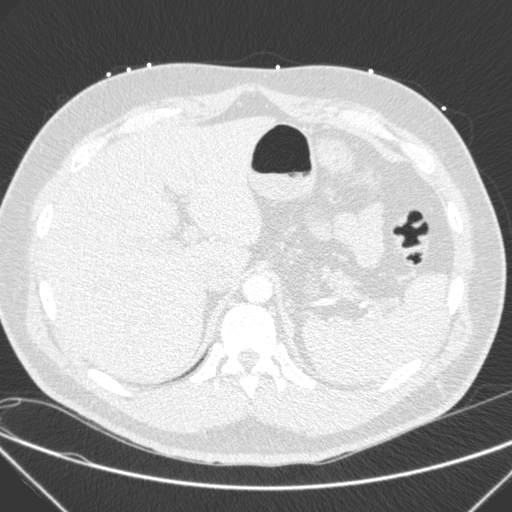
[im 66/297  mediastinal]
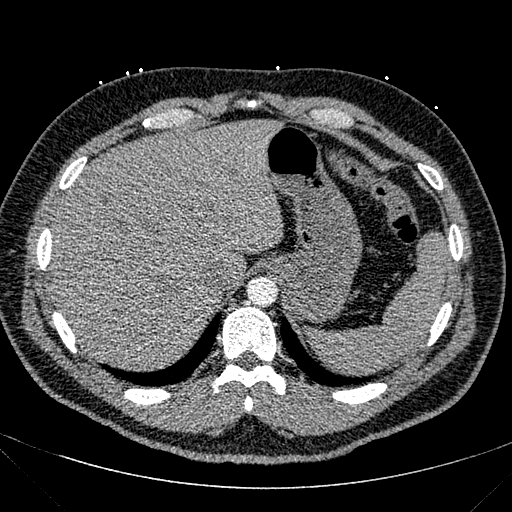
[im 83/297  lung]
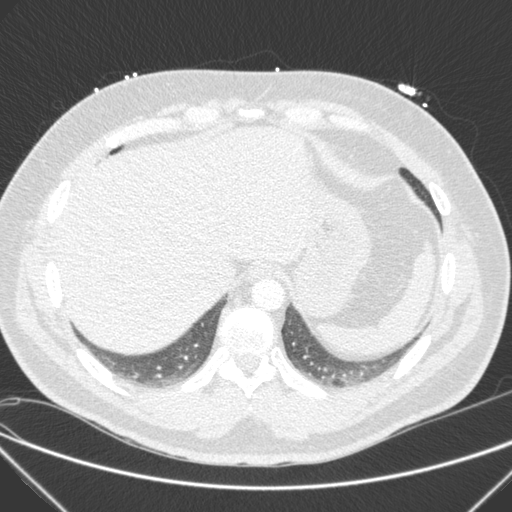
[im 99/297  mediastinal]
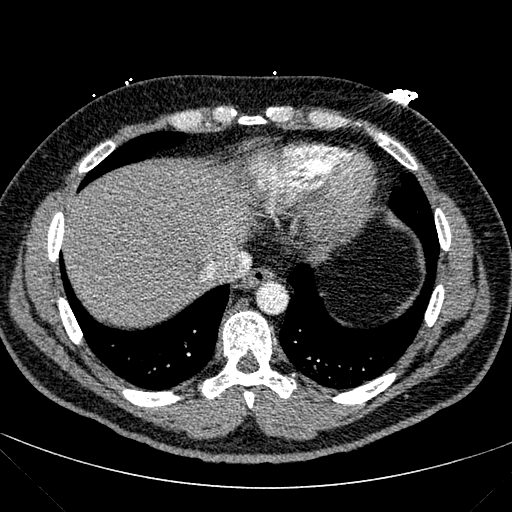
[im 116/297  lung]
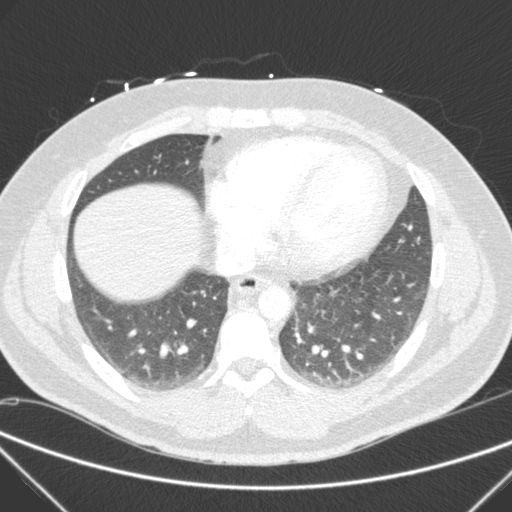
[im 132/297  mediastinal]
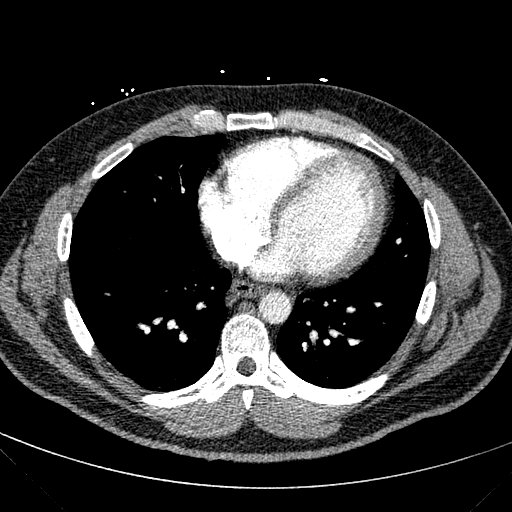
[im 149/297  lung]
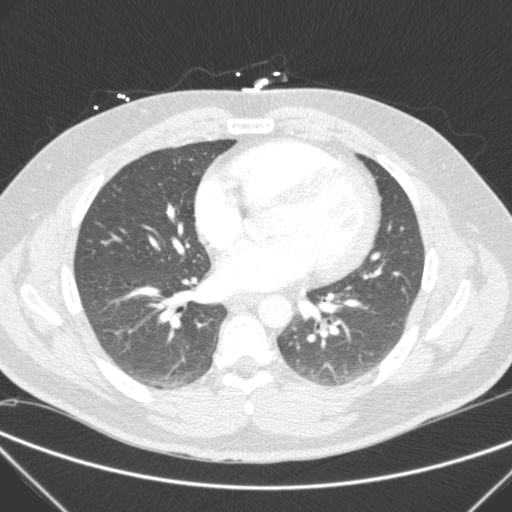
[im 165/297  mediastinal]
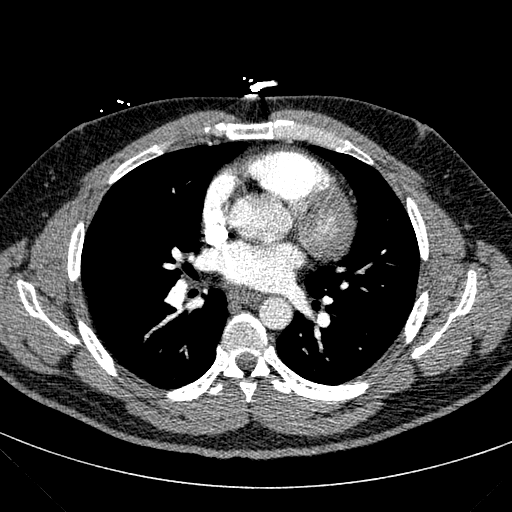
[im 181/297  lung]
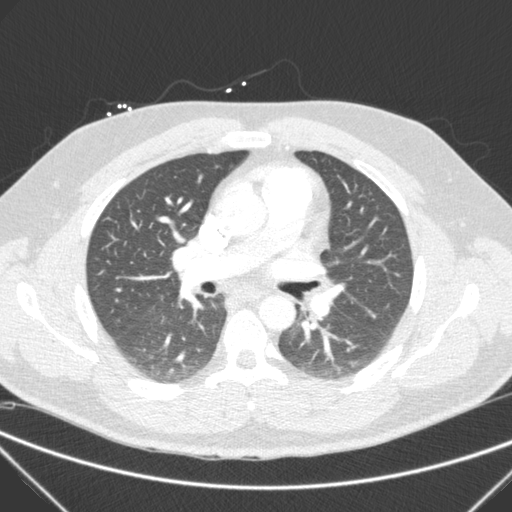
[im 198/297  mediastinal]
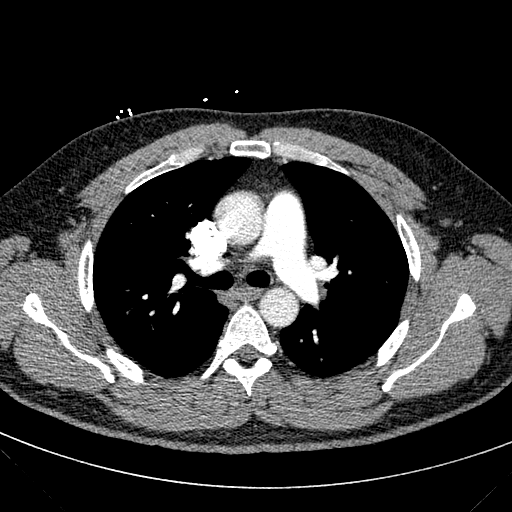
[im 214/297  lung]
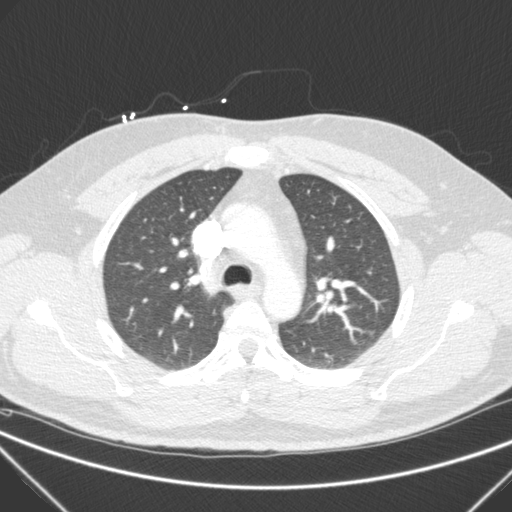
[im 231/297  mediastinal]
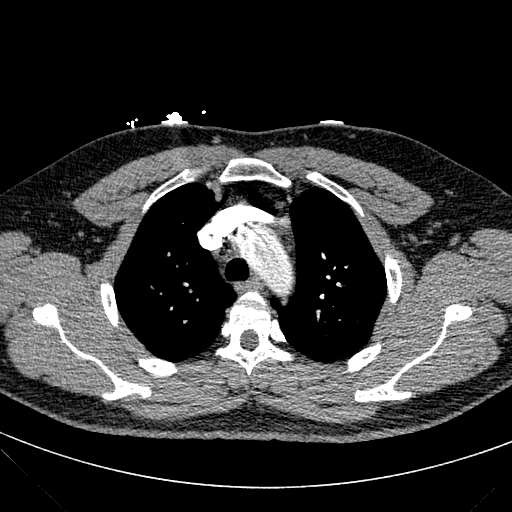
[im 247/297  lung]
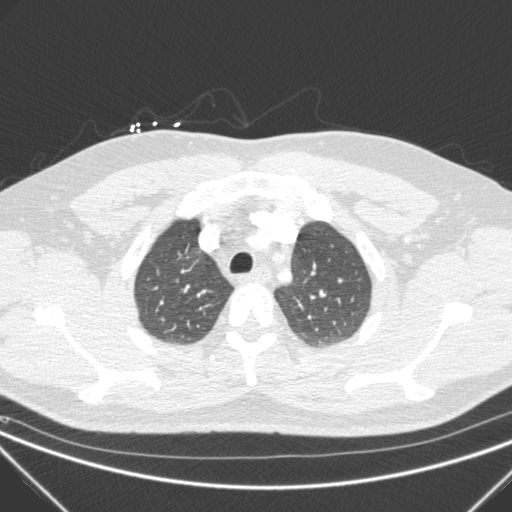
[im 264/297  mediastinal]
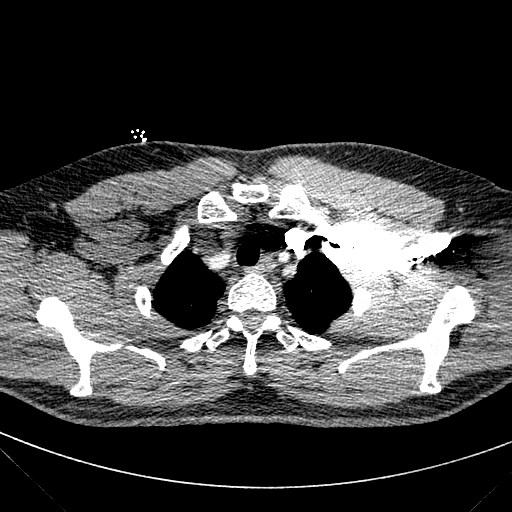
[im 280/297  lung]
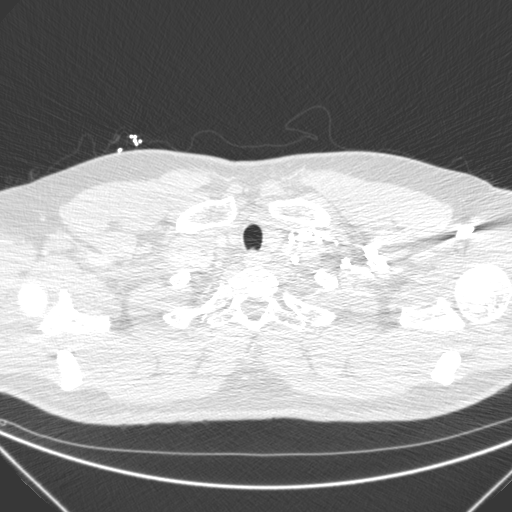

[17 of 36 positions shown; findings below may reference images not displayed]

FINDINGS: Technically adequate study with moderately good opacification of the
central and segmental pulmonary arteries. No filling defects are
demonstrated. No evidence of significant pulmonary embolus.

Normal heart size. Normal caliber thoracic aorta without evidence of
dissection. Great vessel origins are patent. Esophagus is
decompressed. Scattered mediastinal lymph nodes are not
pathologically enlarged.

Dependent atelectasis in the posterior lungs. No focal airspace
disease or consolidation. No pleural effusion. No pneumothorax.

Included portions of the upper abdominal organs demonstrate no gross
focal abnormality. No destructive bone lesions appreciated.

Review of the MIP images confirms the above findings.
IMPRESSION: No evidence of significant pulmonary embolus.

## 2015-06-15 DIAGNOSIS — Z6835 Body mass index (BMI) 35.0-35.9, adult: Secondary | ICD-10-CM | POA: Diagnosis not present

## 2015-06-15 DIAGNOSIS — E109 Type 1 diabetes mellitus without complications: Secondary | ICD-10-CM | POA: Diagnosis not present

## 2015-06-15 DIAGNOSIS — I10 Essential (primary) hypertension: Secondary | ICD-10-CM | POA: Diagnosis not present

## 2015-06-15 DIAGNOSIS — E784 Other hyperlipidemia: Secondary | ICD-10-CM | POA: Diagnosis not present

## 2015-08-11 DIAGNOSIS — E109 Type 1 diabetes mellitus without complications: Secondary | ICD-10-CM | POA: Diagnosis not present

## 2015-11-05 DIAGNOSIS — Z23 Encounter for immunization: Secondary | ICD-10-CM | POA: Diagnosis not present

## 2015-11-05 DIAGNOSIS — Z6834 Body mass index (BMI) 34.0-34.9, adult: Secondary | ICD-10-CM | POA: Diagnosis not present

## 2015-11-05 DIAGNOSIS — I10 Essential (primary) hypertension: Secondary | ICD-10-CM | POA: Diagnosis not present

## 2015-11-05 DIAGNOSIS — Z1389 Encounter for screening for other disorder: Secondary | ICD-10-CM | POA: Diagnosis not present

## 2015-11-05 DIAGNOSIS — E784 Other hyperlipidemia: Secondary | ICD-10-CM | POA: Diagnosis not present

## 2015-11-05 DIAGNOSIS — E109 Type 1 diabetes mellitus without complications: Secondary | ICD-10-CM | POA: Diagnosis not present

## 2015-12-13 DIAGNOSIS — E113292 Type 2 diabetes mellitus with mild nonproliferative diabetic retinopathy without macular edema, left eye: Secondary | ICD-10-CM | POA: Diagnosis not present

## 2015-12-20 DIAGNOSIS — E109 Type 1 diabetes mellitus without complications: Secondary | ICD-10-CM | POA: Diagnosis not present

## 2015-12-20 DIAGNOSIS — I10 Essential (primary) hypertension: Secondary | ICD-10-CM | POA: Diagnosis not present

## 2015-12-20 DIAGNOSIS — Z0000 Encounter for general adult medical examination without abnormal findings: Secondary | ICD-10-CM | POA: Diagnosis not present

## 2015-12-20 DIAGNOSIS — Z125 Encounter for screening for malignant neoplasm of prostate: Secondary | ICD-10-CM | POA: Diagnosis not present

## 2015-12-22 DIAGNOSIS — E109 Type 1 diabetes mellitus without complications: Secondary | ICD-10-CM | POA: Diagnosis not present

## 2015-12-27 DIAGNOSIS — I10 Essential (primary) hypertension: Secondary | ICD-10-CM | POA: Diagnosis not present

## 2015-12-27 DIAGNOSIS — Z0000 Encounter for general adult medical examination without abnormal findings: Secondary | ICD-10-CM | POA: Diagnosis not present

## 2015-12-27 DIAGNOSIS — E668 Other obesity: Secondary | ICD-10-CM | POA: Diagnosis not present

## 2015-12-27 DIAGNOSIS — E11319 Type 2 diabetes mellitus with unspecified diabetic retinopathy without macular edema: Secondary | ICD-10-CM | POA: Diagnosis not present

## 2015-12-27 DIAGNOSIS — Z6835 Body mass index (BMI) 35.0-35.9, adult: Secondary | ICD-10-CM | POA: Diagnosis not present

## 2015-12-27 DIAGNOSIS — E784 Other hyperlipidemia: Secondary | ICD-10-CM | POA: Diagnosis not present

## 2015-12-27 DIAGNOSIS — E109 Type 1 diabetes mellitus without complications: Secondary | ICD-10-CM | POA: Diagnosis not present

## 2015-12-27 DIAGNOSIS — Z1389 Encounter for screening for other disorder: Secondary | ICD-10-CM | POA: Diagnosis not present

## 2016-06-30 IMAGING — CR DG LUMBAR SPINE COMPLETE 4+V
1 series · 5 of 5 positions shown · non-contrast
Comparison: None in PACs

CLINICAL DATA: Onset of low back pain 2 weeks ago; symptoms worsen
while playing golf this weekend, no radicular symptoms.

EXAM:
LUMBAR SPINE - COMPLETE 4+ VIEW

[Series 1: dg lumbar spine complete 4 +v · 0.14mm/px · 5 of 5 slices shown]
[im 1/5]
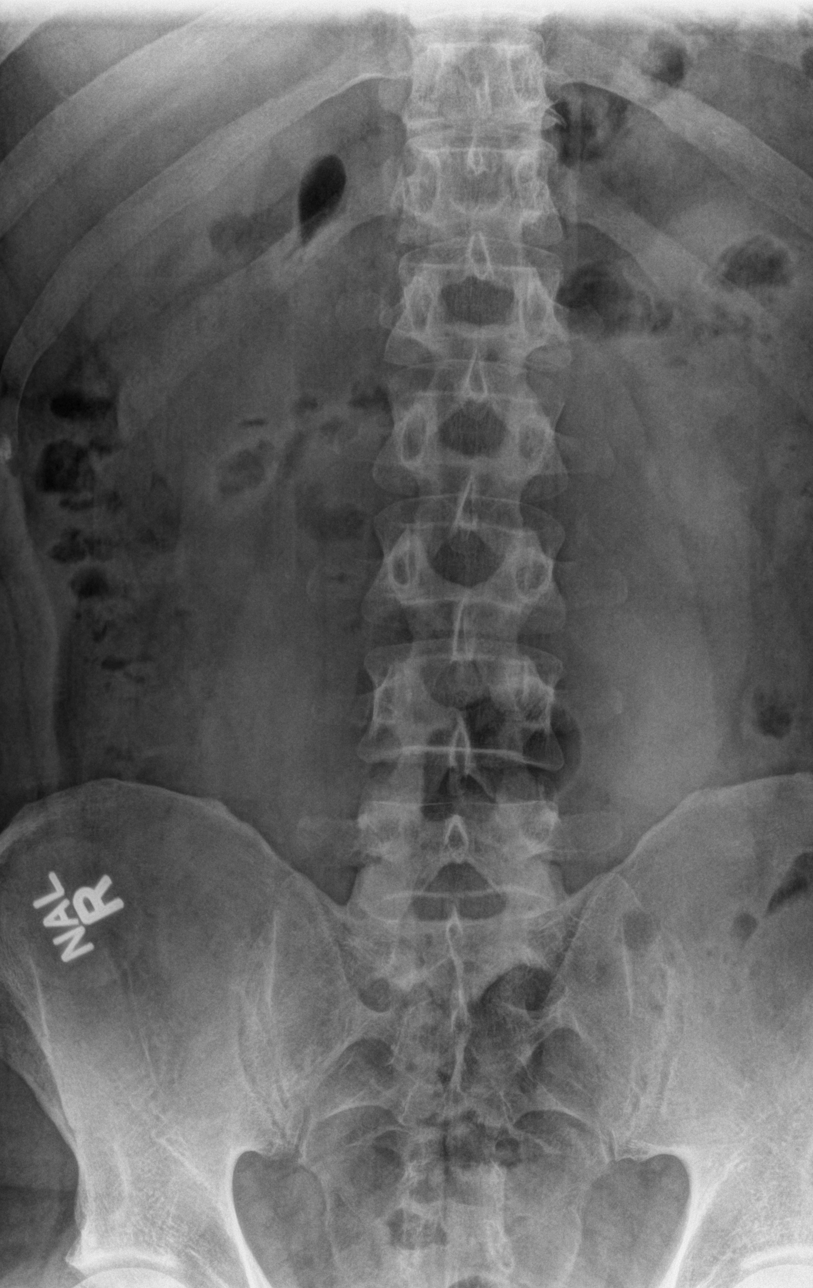
[im 2/5]
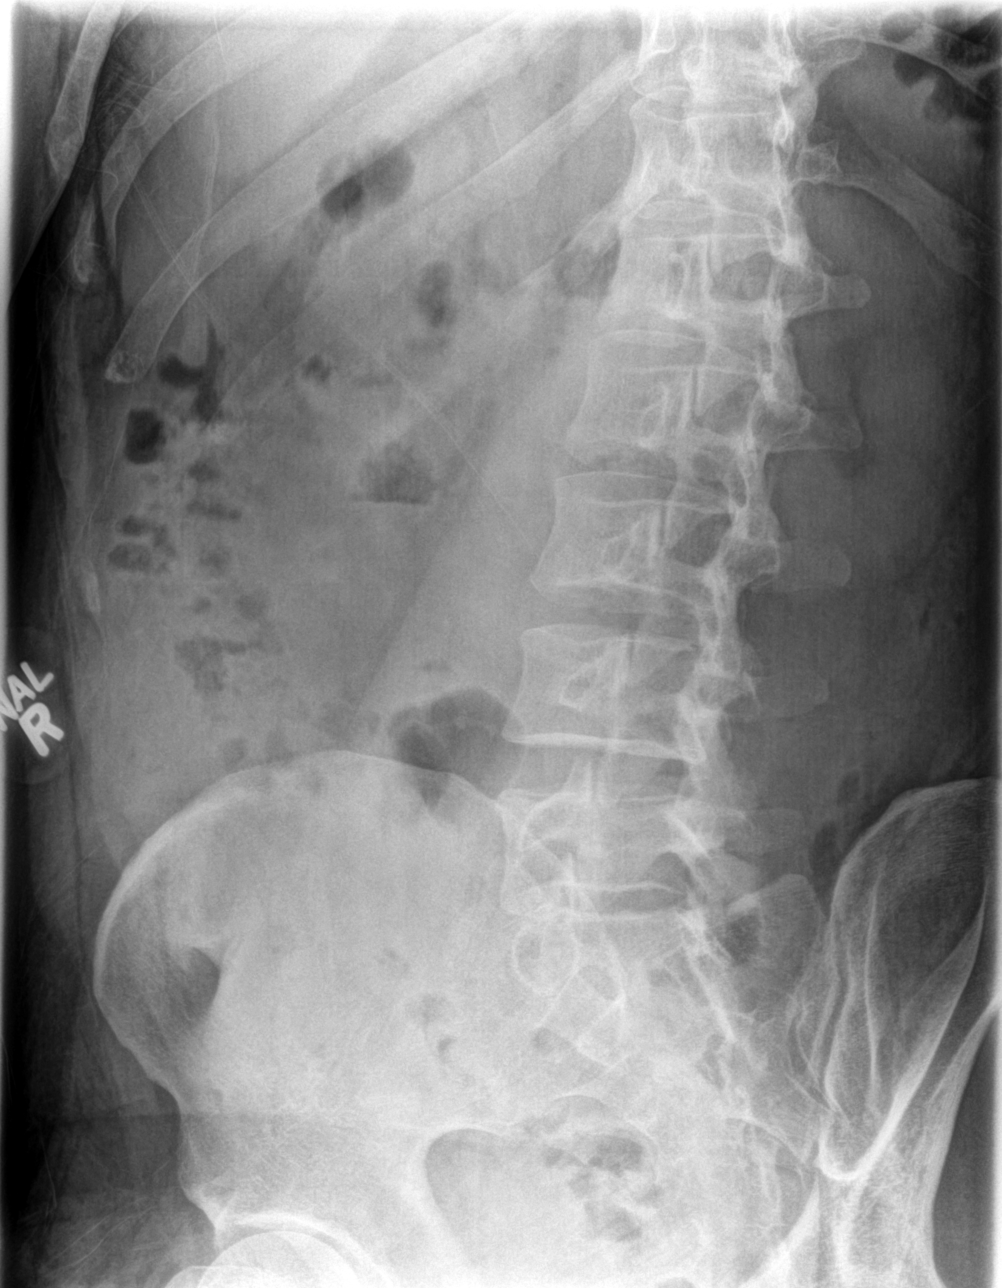
[im 3/5]
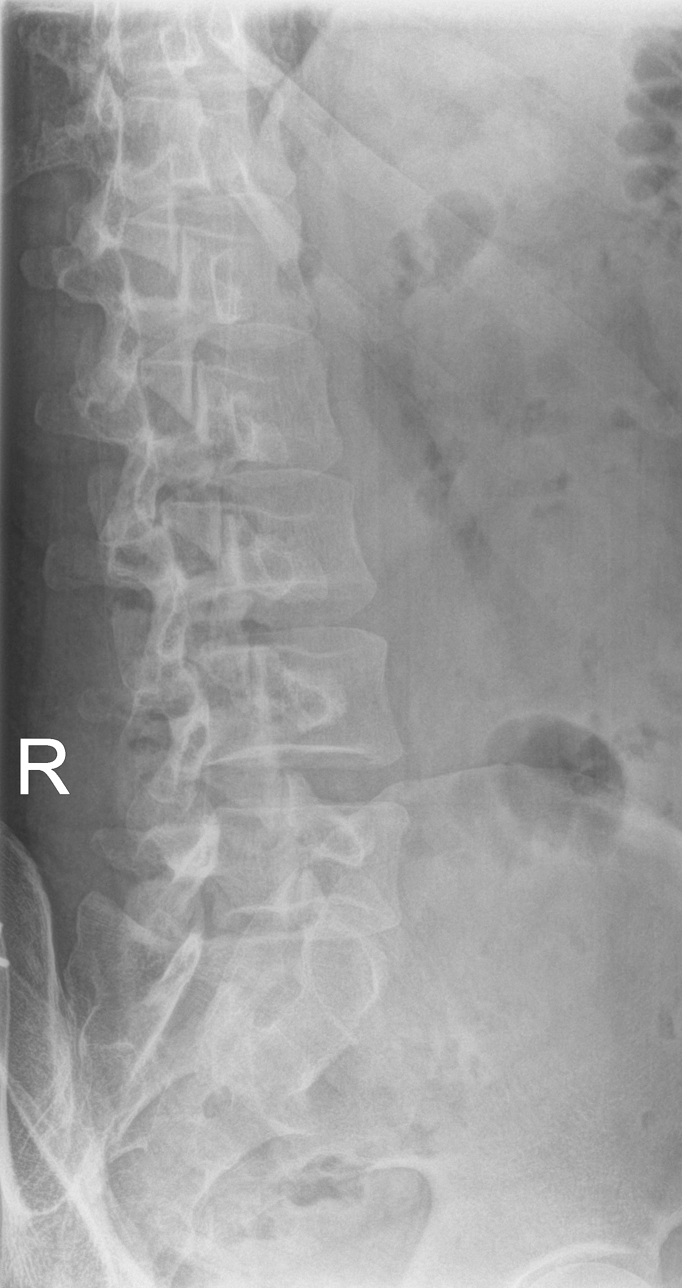
[im 4/5]
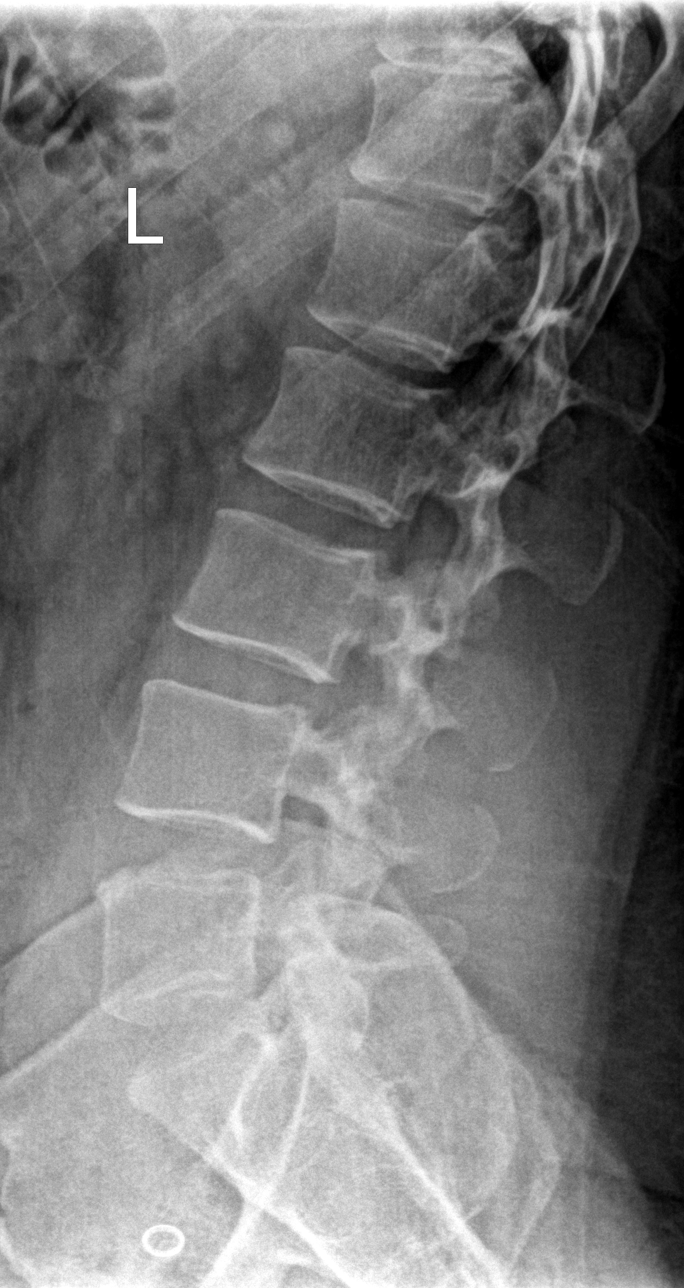
[im 5/5]
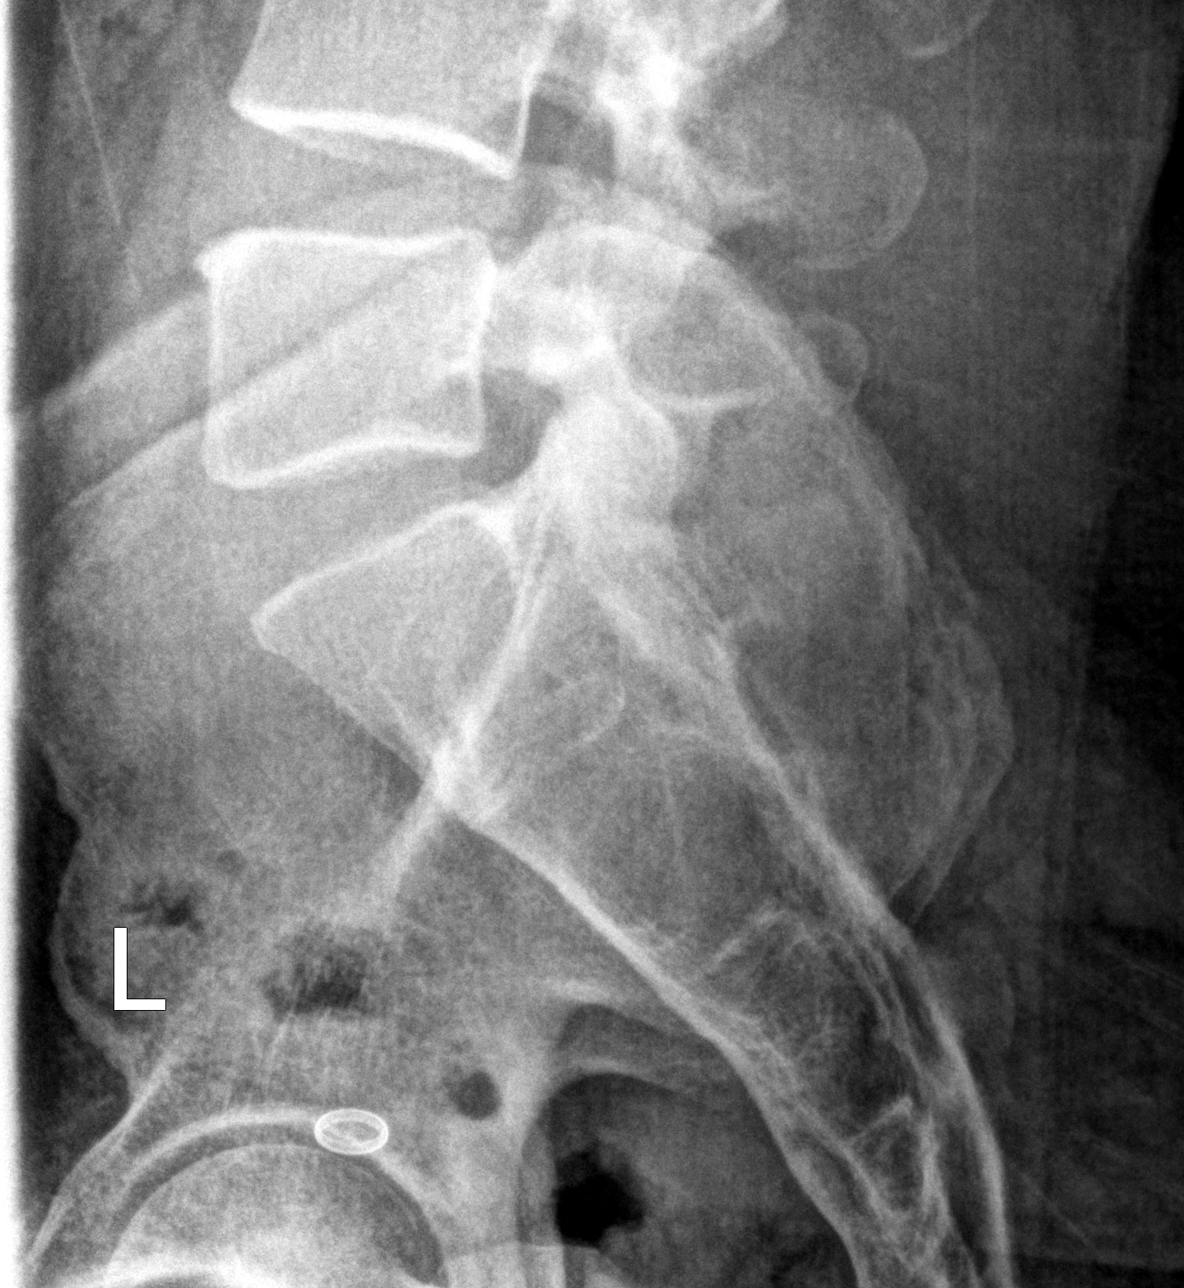

[5 of 5 positions shown; findings below may reference images not displayed]

FINDINGS: The lumbar vertebral bodies are preserved in height. The disc space
heights are well maintained. The pedicles and transverse processes
are intact. There is no spondylolisthesis. The observed portions of
the sacrum are normal.
IMPRESSION: There is no acute or significant chronic bony abnormality of the
lumbar spine.

## 2018-09-26 ENCOUNTER — Encounter (INDEPENDENT_AMBULATORY_CARE_PROVIDER_SITE_OTHER): Payer: BC Managed Care – PPO | Admitting: Ophthalmology

## 2018-09-26 ENCOUNTER — Other Ambulatory Visit: Payer: Self-pay

## 2018-09-26 ENCOUNTER — Encounter (INDEPENDENT_AMBULATORY_CARE_PROVIDER_SITE_OTHER): Payer: 59 | Admitting: Ophthalmology

## 2018-09-26 DIAGNOSIS — H35372 Puckering of macula, left eye: Secondary | ICD-10-CM

## 2018-09-26 DIAGNOSIS — H2513 Age-related nuclear cataract, bilateral: Secondary | ICD-10-CM

## 2018-09-26 DIAGNOSIS — H43813 Vitreous degeneration, bilateral: Secondary | ICD-10-CM

## 2018-09-26 DIAGNOSIS — E103391 Type 1 diabetes mellitus with moderate nonproliferative diabetic retinopathy without macular edema, right eye: Secondary | ICD-10-CM | POA: Diagnosis not present

## 2018-09-26 DIAGNOSIS — E10319 Type 1 diabetes mellitus with unspecified diabetic retinopathy without macular edema: Secondary | ICD-10-CM

## 2018-09-27 ENCOUNTER — Encounter (INDEPENDENT_AMBULATORY_CARE_PROVIDER_SITE_OTHER): Payer: 59 | Admitting: Ophthalmology

## 2018-11-17 ENCOUNTER — Other Ambulatory Visit: Payer: Self-pay

## 2018-11-17 ENCOUNTER — Emergency Department (HOSPITAL_COMMUNITY)
Admission: EM | Admit: 2018-11-17 | Discharge: 2018-11-17 | Disposition: A | Discharge status: Home / Self Care | Payer: BC Managed Care – PPO | Attending: Emergency Medicine | Admitting: Emergency Medicine

## 2018-11-17 DIAGNOSIS — R52 Pain, unspecified: Secondary | ICD-10-CM | POA: Diagnosis not present

## 2018-11-17 DIAGNOSIS — W57XXXA Bitten or stung by nonvenomous insect and other nonvenomous arthropods, initial encounter: Secondary | ICD-10-CM | POA: Insufficient documentation

## 2018-11-17 DIAGNOSIS — T782XXA Anaphylactic shock, unspecified, initial encounter: Secondary | ICD-10-CM | POA: Insufficient documentation

## 2018-11-17 DIAGNOSIS — S80861A Insect bite (nonvenomous), right lower leg, initial encounter: Secondary | ICD-10-CM | POA: Insufficient documentation

## 2018-11-17 DIAGNOSIS — S80862A Insect bite (nonvenomous), left lower leg, initial encounter: Secondary | ICD-10-CM | POA: Diagnosis not present

## 2018-11-17 DIAGNOSIS — R739 Hyperglycemia, unspecified: Secondary | ICD-10-CM

## 2018-11-17 DIAGNOSIS — E1165 Type 2 diabetes mellitus with hyperglycemia: Secondary | ICD-10-CM | POA: Insufficient documentation

## 2018-11-17 DIAGNOSIS — Y999 Unspecified external cause status: Secondary | ICD-10-CM | POA: Diagnosis not present

## 2018-11-17 DIAGNOSIS — I959 Hypotension, unspecified: Secondary | ICD-10-CM | POA: Diagnosis not present

## 2018-11-17 DIAGNOSIS — R404 Transient alteration of awareness: Secondary | ICD-10-CM | POA: Diagnosis not present

## 2018-11-17 DIAGNOSIS — Z794 Long term (current) use of insulin: Secondary | ICD-10-CM | POA: Diagnosis not present

## 2018-11-17 DIAGNOSIS — Y9389 Activity, other specified: Secondary | ICD-10-CM | POA: Diagnosis not present

## 2018-11-17 DIAGNOSIS — Y9289 Other specified places as the place of occurrence of the external cause: Secondary | ICD-10-CM | POA: Diagnosis not present

## 2018-11-17 DIAGNOSIS — R0902 Hypoxemia: Secondary | ICD-10-CM | POA: Diagnosis not present

## 2018-11-17 DIAGNOSIS — R000 Tachycardia, unspecified: Secondary | ICD-10-CM | POA: Diagnosis not present

## 2018-11-17 LAB — CBC
HCT: 45.7 % (ref 39.0–52.0)
Hemoglobin: 15.1 g/dL (ref 13.0–17.0)
MCH: 31.5 pg (ref 26.0–34.0)
MCHC: 33 g/dL (ref 30.0–36.0)
MCV: 95.4 fL (ref 80.0–100.0)
Platelets: 318 10*3/uL (ref 150–400)
RBC: 4.79 MIL/uL (ref 4.22–5.81)
RDW: 12.1 % (ref 11.5–15.5)
WBC: 6.7 10*3/uL (ref 4.0–10.5)
nRBC: 0 % (ref 0.0–0.2)

## 2018-11-17 LAB — BASIC METABOLIC PANEL
Anion gap: 16 — ABNORMAL HIGH (ref 5–15)
BUN: 13 mg/dL (ref 6–20)
CO2: 22 mmol/L (ref 22–32)
Calcium: 8.5 mg/dL — ABNORMAL LOW (ref 8.9–10.3)
Chloride: 102 mmol/L (ref 98–111)
Creatinine, Ser: 1.39 mg/dL — ABNORMAL HIGH (ref 0.61–1.24)
GFR calc Af Amer: >60 mL/min (ref >60)
GFR calc non Af Amer: >60 mL/min (ref >60)
Glucose, Bld: 358 mg/dL — ABNORMAL HIGH (ref 70–99)
Potassium: 3.4 mmol/L — ABNORMAL LOW (ref 3.5–5.1)
Sodium: 140 mmol/L (ref 135–145)

## 2018-11-17 MED ORDER — SODIUM CHLORIDE 0.9 % IV SOLN
INTRAVENOUS | Status: DC
Start: 2018-11-17 — End: 2018-11-17

## 2018-11-17 MED ORDER — EPINEPHRINE 0.3 MG/0.3ML IJ SOAJ: 0.3000 mg | INTRAMUSCULAR | 0 refills | Status: AC | PRN

## 2018-11-17 MED ORDER — SODIUM CHLORIDE 0.9 % IV BOLUS
1000.0000 mL | Freq: Once | INTRAVENOUS | Status: AC
  Administered 2018-11-17: 1000 mL via INTRAVENOUS

## 2018-11-17 MED ORDER — FAMOTIDINE IN NACL 20-0.9 MG/50ML-% IV SOLN
20.0000 mg | Freq: Once | INTRAVENOUS | Status: AC
  Administered 2018-11-17: 20 mg via INTRAVENOUS
  Filled 2018-11-17: qty 50

## 2018-11-17 MED ORDER — PREDNISONE 10 MG (21) PO TBPK: ORAL_TABLET | ORAL | 0 refills | Status: AC

## 2018-11-17 NOTE — ED Notes (Signed)
Patient verbalized understanding of discharge instructions and denies any further needs or questions at this time. VS stable. Patient ambulatory with steady gait. Assisted to ED entrance in wheelchair.   

## 2018-11-17 NOTE — ED Provider Notes (Signed)
[facility] EMERGENCY DEPARTMENT Provider Note   CSN: [id number] Arrival date & time: 11/17/18  1155     History   Chief Complaint Chief Complaint  Patient presents with  . Allergic Reaction    HPI Marvin Schmidt is a 48 y.o. male.     Pt presents to the ED today as an allergic reaction.  Pt stepped into a fire ant pit and was bitten several times on his legs.  He developed sob and mouth swelling.  He was diaphoretic and was in acute distress when EMS arrived.  EMS gave him epi times 2, 125 mg solumedrol, 50 mg benadryl, and IVFs.  Pt is starting to feel better now and denies sob.      Past Medical History:  Diagnosis Date  . Diabetes mellitus without complication   . Hyperlipidemia    hx  . Syncope and collapse     Patient Active Problem List   Diagnosis Date Noted  . Hearing loss 11/03/2013  . Diabetes mellitus type 1 (HCC) 10/04/2013  . Syncope 10/04/2013  . Chest pain 10/03/2013    No past surgical history on file.      Home Medications    Prior to Admission medications   Medication Sig Start Date End Date Taking? Authorizing Provider  EPINEPHrine 0.3 mg/0.3 mL IJ SOAJ injection Inject 0.3 mLs (0.3 mg total) into the muscle as needed for anaphylaxis. 11/17/18   Jacalyn Lefevre, MD  Insulin Human (INSULIN PUMP) SOLN Inject into the skin continuous. "novolog"    [provider]  predniSONE (STERAPRED UNI-PAK 21 TAB) 10 MG (21) TBPK tablet Take 6 tabs for 2 days, then 5 for 2 days, then 4 for 2 days, then 3 for 2 days, 2 for 2 days, then 1 for 2 days 11/17/18   Jacalyn Lefevre, MD    Family History Family History  Problem Relation Age of Onset  . Colon cancer Father   . Diabetes Mellitus II Neg Hx   . CAD Neg Hx     Social History Social History   Tobacco Use  . Smoking status: Never Smoker  Substance Use Topics  . Alcohol use: Yes    Comment: beer occasionally.  . Drug use: No     Allergies   Patient has no  known allergies.   Review of Systems Review of Systems  Constitutional: Positive for diaphoresis.  Respiratory: Positive for shortness of breath.   Skin: Positive for rash.  All other systems reviewed and are negative.    Physical Exam Updated Vital Signs BP 128/71   Pulse 98   Temp (!) 97.4 F (36.3 C) (Oral)   Resp 19   SpO2 95%   Physical Exam Vitals signs and nursing note reviewed.  Constitutional:      Appearance: Normal appearance.  HENT:     Head: Normocephalic and atraumatic.     Right Ear: External ear normal.     Left Ear: External ear normal.     Nose: Nose normal.     Mouth/Throat:     Mouth: Mucous membranes are dry.  Eyes:     Extraocular Movements: Extraocular movements intact.     Conjunctiva/sclera: Conjunctivae normal.     Pupils: Pupils are equal, round, and reactive to light.  Neck:     Musculoskeletal: Normal range of motion and neck supple.  Cardiovascular:     Rate and Rhythm: Normal rate and regular rhythm.     Pulses: Normal pulses.  Heart sounds: Normal heart sounds.  Pulmonary:     Effort: Pulmonary effort is normal.     Breath sounds: Normal breath sounds.  Abdominal:     General: Abdomen is flat. Bowel sounds are normal.     Palpations: Abdomen is soft.  Musculoskeletal: Normal range of motion.  Skin:    General: Skin is warm.     Capillary Refill: Capillary refill takes less than 2 seconds.  Neurological:     General: No focal deficit present.     Mental Status: He is alert and oriented to person, place, and time.  Psychiatric:        Mood and Affect: Mood normal.        Behavior: Behavior normal.      ED Treatments / Results  Labs (all labs ordered are listed, but only abnormal results are displayed) Labs Reviewed  BASIC METABOLIC PANEL - Abnormal; Notable for the following components:      Result Value   Potassium 3.4 (*)    Glucose, Bld 358 (*)    Creatinine, Ser 1.39 (*)    Calcium 8.5 (*)    Anion gap 16 (*)     All other components within normal limits  CBC    EKG EKG Interpretation  Date/Time:  Sunday November 17 2018 12:04:06 EDT Ventricular Rate:  95 PR Interval:    QRS Duration: 92 QT Interval:  374 QTC Calculation: 471 R Axis:   40 Text Interpretation:  Sinus rhythm Borderline T abnormalities, inferior leads No significant change since last tracing Confirmed by Jacalyn LefevreHaviland, Rashawn Rolon [phone]([id number]) on 11/17/2018 12:07:20 PM   Radiology No results found.  Procedures Procedures (including critical care time)  Medications Ordered in ED Medications  sodium chloride 0.9 % bolus 1,000 mL (0 mLs Intravenous Stopped 11/17/18 1356)    And  0.9 %  sodium chloride infusion (has no administration in time range)  famotidine (PEPCID) IVPB 20 mg premix (0 mg Intravenous Stopped 11/17/18 1254)     Initial Impression / Assessment and Plan / ED Course  I have reviewed the triage vital signs and the nursing notes.  Pertinent labs & imaging results that were available during my care of the patient were reviewed by me and considered in my medical decision making (see chart for details).  Pt's BS is elevated.  He has an insulin pump and will keep a close eye on his blood sugar.        Pt was observed for several hrs in the ED and has continued to improve.  No sob.  His dad is here and will take him home.   He will be d/c home with instructions to return if worse.    CRITICAL CARE Performed by: Jacalyn LefevreJulie Mayeli Bornhorst   Total critical care time: 30 minutes  Critical care time was exclusive of separately billable procedures and treating other patients.  Critical care was necessary to treat or prevent imminent or life-threatening deterioration.  Critical care was time spent personally by me on the following activities: development of treatment plan with patient and/or surrogate as well as nursing, discussions with consultants, evaluation of patient's response to treatment, examination of patient, obtaining  history from patient or surrogate, ordering and performing treatments and interventions, ordering and review of laboratory studies, ordering and review of radiographic studies, pulse oximetry and re-evaluation of patient's condition.  Final Clinical Impressions(s) / ED Diagnoses   Final diagnoses:  Anaphylaxis, initial encounter  Hyperglycemia    ED Discharge Orders  Ordered    EPINEPHrine 0.3 mg/0.3 mL IJ SOAJ injection  As needed     11/17/18 1328    predniSONE (STERAPRED UNI-PAK 21 TAB) 10 MG (21) TBPK tablet     11/17/18 1424           Isla Pence, MD 11/17/18 1425

## 2018-11-17 NOTE — ED Triage Notes (Addendum)
Patient in via [facility] from home for allergic reaction - states he stepped in a fire ant pit and was bitten multiple times on his legs. No notable bite marks noted, but per EMS, patient exhibited angioedema, diaphoresis, vomiting, hives on arrival. Patient feels like he passed out right after it happened - initial BP 88/40 - increased to 108/74 shortly afterward. Lungs sounds clear, patient denies shortness of breath or throat or mouth swelling. Voice clear. No history of same, patient denies allergies. Patient given 2 x 0.3 epinephrine, 125mg  solumedrol, 50mg  benadryl, and 300cc NS bolus PTA with some improvement. EMS VS: HR 112 ST, RR 20, 93% RA, CBG 244 (has insulin pump). 18g. PIV LAC. A&O x 4. Resp e/u, skin slightly cool and clammy.

## 2019-09-26 ENCOUNTER — Encounter (INDEPENDENT_AMBULATORY_CARE_PROVIDER_SITE_OTHER): Payer: 59 | Admitting: Ophthalmology

## 2021-12-15 ENCOUNTER — Encounter: Payer: Self-pay | Admitting: Internal Medicine

## 2022-04-06 ENCOUNTER — Other Ambulatory Visit (HOSPITAL_COMMUNITY): Payer: Self-pay

## 2022-04-06 ENCOUNTER — Other Ambulatory Visit: Payer: Self-pay

## 2022-04-06 MED ORDER — MOUNJARO 10 MG/0.5ML ~~LOC~~ SOAJ
10.0000 mg | SUBCUTANEOUS | 11 refills | Status: AC
  Filled 2022-04-06: qty 2, 28d supply, fill #0
  Filled 2022-04-27: qty 2, 28d supply, fill #1
  Filled 2022-05-26: qty 2, 28d supply, fill #0
  Filled 2022-05-26: qty 2, 28d supply, fill #2
  Filled 2022-06-25: qty 2, 28d supply, fill #1
  Filled 2022-08-08: qty 2, 28d supply, fill #2
  Filled 2022-09-04: qty 2, 28d supply, fill #3
  Filled 2022-10-01: qty 2, 28d supply, fill #4
  Filled 2022-10-30: qty 2, 28d supply, fill #5
  Filled 2022-11-27: qty 2, 28d supply, fill #6

## 2022-04-07 ENCOUNTER — Other Ambulatory Visit (HOSPITAL_COMMUNITY): Payer: Self-pay

## 2022-04-27 ENCOUNTER — Other Ambulatory Visit (HOSPITAL_BASED_OUTPATIENT_CLINIC_OR_DEPARTMENT_OTHER): Payer: Self-pay

## 2022-04-27 ENCOUNTER — Other Ambulatory Visit (HOSPITAL_COMMUNITY): Payer: Self-pay

## 2022-05-26 ENCOUNTER — Other Ambulatory Visit: Payer: Self-pay

## 2022-05-26 ENCOUNTER — Other Ambulatory Visit (HOSPITAL_BASED_OUTPATIENT_CLINIC_OR_DEPARTMENT_OTHER): Payer: Self-pay

## 2022-05-26 ENCOUNTER — Other Ambulatory Visit (HOSPITAL_COMMUNITY): Payer: Self-pay

## 2022-05-30 ENCOUNTER — Other Ambulatory Visit (HOSPITAL_COMMUNITY): Payer: Self-pay

## 2022-06-25 ENCOUNTER — Other Ambulatory Visit (HOSPITAL_BASED_OUTPATIENT_CLINIC_OR_DEPARTMENT_OTHER): Payer: Self-pay

## 2022-06-27 ENCOUNTER — Other Ambulatory Visit (HOSPITAL_BASED_OUTPATIENT_CLINIC_OR_DEPARTMENT_OTHER): Payer: Self-pay

## 2022-06-28 ENCOUNTER — Other Ambulatory Visit (HOSPITAL_BASED_OUTPATIENT_CLINIC_OR_DEPARTMENT_OTHER): Payer: Self-pay

## 2022-07-06 ENCOUNTER — Other Ambulatory Visit (HOSPITAL_BASED_OUTPATIENT_CLINIC_OR_DEPARTMENT_OTHER): Payer: Self-pay

## 2022-07-11 ENCOUNTER — Other Ambulatory Visit (HOSPITAL_BASED_OUTPATIENT_CLINIC_OR_DEPARTMENT_OTHER): Payer: Self-pay

## 2022-07-14 ENCOUNTER — Other Ambulatory Visit (HOSPITAL_BASED_OUTPATIENT_CLINIC_OR_DEPARTMENT_OTHER): Payer: Self-pay

## 2022-08-08 ENCOUNTER — Other Ambulatory Visit (HOSPITAL_BASED_OUTPATIENT_CLINIC_OR_DEPARTMENT_OTHER): Payer: Self-pay

## 2022-09-04 ENCOUNTER — Other Ambulatory Visit (HOSPITAL_BASED_OUTPATIENT_CLINIC_OR_DEPARTMENT_OTHER): Payer: Self-pay

## 2022-10-30 ENCOUNTER — Other Ambulatory Visit (HOSPITAL_BASED_OUTPATIENT_CLINIC_OR_DEPARTMENT_OTHER): Payer: Self-pay

## 2022-11-04 ENCOUNTER — Other Ambulatory Visit (HOSPITAL_BASED_OUTPATIENT_CLINIC_OR_DEPARTMENT_OTHER): Payer: Self-pay

## 2022-12-25 ENCOUNTER — Other Ambulatory Visit (HOSPITAL_BASED_OUTPATIENT_CLINIC_OR_DEPARTMENT_OTHER): Payer: Self-pay

## 2022-12-25 MED ORDER — MOUNJARO 12.5 MG/0.5ML ~~LOC~~ SOAJ
12.5000 mg | SUBCUTANEOUS | 11 refills | Status: AC
  Filled 2022-12-25: qty 2, 28d supply, fill #0
  Filled 2023-01-17: qty 2, 28d supply, fill #1
  Filled 2023-02-23: qty 2, 28d supply, fill #2
  Filled 2023-03-21 – 2023-04-03 (×2): qty 2, 28d supply, fill #3
  Filled 2023-04-24: qty 2, 28d supply, fill #4
  Filled 2023-05-30 – 2023-06-08 (×2): qty 2, 28d supply, fill #5
  Filled 2023-07-09: qty 2, 28d supply, fill #6
  Filled 2023-08-17: qty 2, 28d supply, fill #7
  Filled 2023-09-15: qty 2, 28d supply, fill #8
  Filled 2023-10-26: qty 2, 28d supply, fill #9
  Filled 2023-11-25: qty 2, 28d supply, fill #10
  Filled 2023-12-23: qty 2, 28d supply, fill #11

## 2023-01-17 ENCOUNTER — Other Ambulatory Visit (HOSPITAL_BASED_OUTPATIENT_CLINIC_OR_DEPARTMENT_OTHER): Payer: Self-pay

## 2023-01-19 ENCOUNTER — Other Ambulatory Visit (HOSPITAL_BASED_OUTPATIENT_CLINIC_OR_DEPARTMENT_OTHER): Payer: Self-pay

## 2023-01-22 ENCOUNTER — Other Ambulatory Visit (HOSPITAL_BASED_OUTPATIENT_CLINIC_OR_DEPARTMENT_OTHER): Payer: Self-pay

## 2023-02-23 ENCOUNTER — Other Ambulatory Visit (HOSPITAL_BASED_OUTPATIENT_CLINIC_OR_DEPARTMENT_OTHER): Payer: Self-pay

## 2023-03-21 ENCOUNTER — Other Ambulatory Visit (HOSPITAL_BASED_OUTPATIENT_CLINIC_OR_DEPARTMENT_OTHER): Payer: Self-pay

## 2023-03-27 ENCOUNTER — Other Ambulatory Visit (HOSPITAL_BASED_OUTPATIENT_CLINIC_OR_DEPARTMENT_OTHER): Payer: Self-pay

## 2023-03-31 ENCOUNTER — Other Ambulatory Visit (HOSPITAL_BASED_OUTPATIENT_CLINIC_OR_DEPARTMENT_OTHER): Payer: Self-pay

## 2023-04-03 ENCOUNTER — Other Ambulatory Visit (HOSPITAL_BASED_OUTPATIENT_CLINIC_OR_DEPARTMENT_OTHER): Payer: Self-pay

## 2023-05-30 ENCOUNTER — Other Ambulatory Visit (HOSPITAL_BASED_OUTPATIENT_CLINIC_OR_DEPARTMENT_OTHER): Payer: Self-pay

## 2023-06-08 ENCOUNTER — Other Ambulatory Visit (HOSPITAL_BASED_OUTPATIENT_CLINIC_OR_DEPARTMENT_OTHER): Payer: Self-pay

## 2023-07-09 ENCOUNTER — Other Ambulatory Visit (HOSPITAL_BASED_OUTPATIENT_CLINIC_OR_DEPARTMENT_OTHER): Payer: Self-pay

## 2023-10-26 ENCOUNTER — Other Ambulatory Visit (HOSPITAL_BASED_OUTPATIENT_CLINIC_OR_DEPARTMENT_OTHER): Payer: Self-pay

## 2023-10-31 ENCOUNTER — Other Ambulatory Visit (HOSPITAL_BASED_OUTPATIENT_CLINIC_OR_DEPARTMENT_OTHER): Payer: Self-pay

## 2023-12-25 ENCOUNTER — Other Ambulatory Visit (HOSPITAL_BASED_OUTPATIENT_CLINIC_OR_DEPARTMENT_OTHER): Payer: Self-pay

## 2023-12-25 MED ORDER — MOUNJARO 15 MG/0.5ML ~~LOC~~ SOAJ
15.0000 mg | SUBCUTANEOUS | 6 refills | Status: AC
  Filled 2023-12-25 – 2023-12-26 (×2): qty 2, 28d supply, fill #0
  Filled 2024-01-25: qty 2, 28d supply, fill #1
  Filled 2024-02-20 – 2024-03-03 (×2): qty 2, 28d supply, fill #2

## 2023-12-26 ENCOUNTER — Other Ambulatory Visit (HOSPITAL_BASED_OUTPATIENT_CLINIC_OR_DEPARTMENT_OTHER): Payer: Self-pay

## 2023-12-31 ENCOUNTER — Other Ambulatory Visit (HOSPITAL_BASED_OUTPATIENT_CLINIC_OR_DEPARTMENT_OTHER): Payer: Self-pay

## 2024-01-01 ENCOUNTER — Other Ambulatory Visit (HOSPITAL_BASED_OUTPATIENT_CLINIC_OR_DEPARTMENT_OTHER): Payer: Self-pay

## 2024-01-01 MED ORDER — INSULIN ASPART 100 UNIT/ML IJ SOLN
150.0000 [IU] | Freq: Every day | INTRAMUSCULAR | 1 refills | Status: AC
  Filled 2024-01-01: qty 140, 93d supply, fill #0
  Filled 2024-03-03: qty 140, 90d supply, fill #0

## 2024-01-02 ENCOUNTER — Other Ambulatory Visit (HOSPITAL_BASED_OUTPATIENT_CLINIC_OR_DEPARTMENT_OTHER): Payer: Self-pay

## 2024-03-03 ENCOUNTER — Other Ambulatory Visit (HOSPITAL_BASED_OUTPATIENT_CLINIC_OR_DEPARTMENT_OTHER): Payer: Self-pay

## 2024-03-03 ENCOUNTER — Other Ambulatory Visit: Payer: Self-pay

## 2024-03-04 ENCOUNTER — Other Ambulatory Visit (HOSPITAL_BASED_OUTPATIENT_CLINIC_OR_DEPARTMENT_OTHER): Payer: Self-pay
